# Patient Record
Sex: Female | Born: 1990
Health system: Southern US, Community
[De-identification: ages and names within clinical notes are randomized; demographics above are authoritative.]

## PROBLEM LIST (undated history)

## (undated) DIAGNOSIS — F32A Depression, unspecified: Secondary | ICD-10-CM

## (undated) DIAGNOSIS — E162 Hypoglycemia, unspecified: Secondary | ICD-10-CM

## (undated) DIAGNOSIS — D649 Anemia, unspecified: Secondary | ICD-10-CM

## (undated) DIAGNOSIS — F419 Anxiety disorder, unspecified: Secondary | ICD-10-CM

## (undated) DIAGNOSIS — K5792 Diverticulitis of intestine, part unspecified, without perforation or abscess without bleeding: Secondary | ICD-10-CM

## (undated) DIAGNOSIS — J45909 Unspecified asthma, uncomplicated: Secondary | ICD-10-CM

## (undated) HISTORY — DX: Hypoglycemia, unspecified: E16.2

## (undated) HISTORY — DX: Anemia, unspecified: D64.9

## (undated) HISTORY — DX: Diverticulitis of intestine, part unspecified, without perforation or abscess without bleeding: K57.92

## (undated) HISTORY — DX: Depression, unspecified: F32.A

## (undated) HISTORY — DX: Anxiety disorder, unspecified: F41.9

---
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
## 2011-07-01 ENCOUNTER — Emergency Department (HOSPITAL_COMMUNITY)
Admission: EM | Admit: 2011-07-01 | Discharge: 2011-07-01 | Disposition: A | Discharge status: Home / Self Care | Payer: 59 | Attending: Emergency Medicine | Admitting: Emergency Medicine

## 2011-07-01 DIAGNOSIS — R42 Dizziness and giddiness: Secondary | ICD-10-CM | POA: Insufficient documentation

## 2011-07-01 DIAGNOSIS — R112 Nausea with vomiting, unspecified: Secondary | ICD-10-CM | POA: Insufficient documentation

## 2011-07-01 LAB — POCT I-STAT, CHEM 8
BUN: 9 mg/dL (ref 6–23)
Calcium, Ion: 1.26 mmol/L (ref 1.12–1.32)
Chloride: 104 mEq/L (ref 96–112)
Creatinine, Ser: 0.6 mg/dL (ref 0.50–1.10)
Glucose, Bld: 112 mg/dL — ABNORMAL HIGH (ref 70–99)
HCT: 39 % (ref 36.0–46.0)
Hemoglobin: 13.3 g/dL (ref 12.0–15.0)
Potassium: 3.8 mEq/L (ref 3.5–5.1)
Sodium: 142 mEq/L (ref 135–145)
TCO2: 24 mmol/L (ref 0–100)

## 2011-07-01 LAB — GLUCOSE, CAPILLARY: Glucose-Capillary: 129 mg/dL — ABNORMAL HIGH (ref 70–99)

## 2011-08-05 LAB — BASIC METABOLIC PANEL
BUN: 7
CO2: 25
Calcium: 9
Chloride: 105
Creatinine, Ser: 0.53
Glucose, Bld: 82
Potassium: 4.5
Sodium: 139

## 2011-08-05 LAB — DIFFERENTIAL
Basophils Absolute: 0
Basophils Relative: 0
Eosinophils Absolute: 0.1
Eosinophils Relative: 1
Lymphocytes Relative: 20 — ABNORMAL LOW
Lymphs Abs: 1.8
Monocytes Absolute: 0.5
Monocytes Relative: 6
Neutro Abs: 6.4
Neutrophils Relative %: 72 — ABNORMAL HIGH

## 2011-08-05 LAB — URINALYSIS, ROUTINE W REFLEX MICROSCOPIC
Bilirubin Urine: NEGATIVE
Glucose, UA: NEGATIVE
Hgb urine dipstick: NEGATIVE
Ketones, ur: NEGATIVE
Nitrite: NEGATIVE
Protein, ur: NEGATIVE
Specific Gravity, Urine: 1.013
Urobilinogen, UA: 1
pH: 7

## 2011-08-05 LAB — CBC
HCT: 40.3
Hemoglobin: 13.7
MCHC: 34.1 — ABNORMAL HIGH
MCV: 86.4
Platelets: 284
RBC: 4.66
RDW: 12.7
WBC: 8.9

## 2011-08-05 LAB — POCT PREGNANCY, URINE
Operator id: 272551
Preg Test, Ur: NEGATIVE

## 2011-08-05 LAB — URINE CULTURE
Colony Count: NO GROWTH
Culture: NO GROWTH

## 2011-10-25 ENCOUNTER — Ambulatory Visit (INDEPENDENT_AMBULATORY_CARE_PROVIDER_SITE_OTHER): Payer: 59

## 2011-10-25 DIAGNOSIS — R05 Cough: Secondary | ICD-10-CM

## 2011-10-25 DIAGNOSIS — R059 Cough, unspecified: Secondary | ICD-10-CM

## 2011-10-25 DIAGNOSIS — J029 Acute pharyngitis, unspecified: Secondary | ICD-10-CM

## 2012-02-06 ENCOUNTER — Ambulatory Visit (INDEPENDENT_AMBULATORY_CARE_PROVIDER_SITE_OTHER): Payer: 59 | Admitting: Family

## 2012-02-06 ENCOUNTER — Encounter: Payer: Self-pay | Admitting: Family

## 2012-02-06 VITALS — BP 110/70 | Ht 69.0 in | Wt 144.0 lb

## 2012-02-06 DIAGNOSIS — T7840XA Allergy, unspecified, initial encounter: Secondary | ICD-10-CM

## 2012-02-06 DIAGNOSIS — L299 Pruritus, unspecified: Secondary | ICD-10-CM

## 2012-02-06 MED ORDER — PREDNISONE 20 MG PO TABS: 60.0000 mg | ORAL_TABLET | Freq: Every day | ORAL | Status: AC

## 2012-02-06 MED ORDER — HYDROXYZINE PAMOATE 50 MG PO CAPS: 50.0000 mg | ORAL_CAPSULE | Freq: Three times a day (TID) | ORAL | Status: AC | PRN

## 2012-02-06 NOTE — Progress Notes (Signed)
  Subjective:    Patient ID: Tara Jones, female    DOB: Jan 18, 1991, 21 y.o.   MRN: [id number]  HPI Comments: 21 yo white female presents with c/o "itching" all started one day ago. She noticed a hive on left arm two days before itching began  and states she has multiple allergies to medications and had hives in the past. Denies rash, hives, or other unusual areas on upper or lower extremities. Denies dry skin. Was seen in urgent care. Was given depo-medrol injection and benadryl prn. Stating both medications are ineffective and benadryl causing drowsiness. Denies fever, chills, shortness or breath, cp, or congestion.      Review of Systems  Constitutional: Negative.   HENT: Negative.   Respiratory: Negative.   Cardiovascular: Negative.   Musculoskeletal: Positive for arthralgias.  Skin: Negative for color change, pallor and rash.   Past Medical History  Diagnosis Date  . Diverticulitis   . Migraine   . Anemia   . Hypoglycemia     History   Social History  . Marital Status: Single    Spouse Name: N/A    Number of Children: N/A  . Years of Education: N/A   Occupational History  . Not on file.   Social History Main Topics  . Smoking status: Never Smoker   . Smokeless tobacco: Not on file  . Alcohol Use: No  . Drug Use: No  . Sexually Active: Not on file   Other Topics Concern  . Not on file   Social History Narrative  . No narrative on file    No past surgical history on file.  Family History  Problem Relation Age of Onset  . Hodgkin's lymphoma Mother   . Cancer Mother     breast, thyroid    Allergies  Allergen Reactions  . Amoxicillin   . Erythromycin   . Keflex   . Penicillins     No current outpatient prescriptions on file prior to visit.    BP 110/70  Ht 5\' 9"  (1.753 m)  Wt 144 lb (65.318 kg)  BMI 21.27 kg/m2  LMP 03/15/2013chart    Objective:   Physical Exam  Constitutional: She is oriented to person, place, and time. She appears  well-developed and well-nourished. No distress.  Cardiovascular: Normal rate, regular rhythm, normal heart sounds and intact distal pulses.  Exam reveals no gallop and no friction rub.   No murmur heard. Pulmonary/Chest: Effort normal and breath sounds normal. No respiratory distress. She has no wheezes. She has no rales. She exhibits no tenderness.  Neurological: She is alert and oriented to person, place, and time.  Skin: Skin is warm and dry. No rash noted. She is not diaphoretic. No erythema. No pallor.          Hive left arm red and itchy           Assessment & Plan:  Assessment: Allergic reaction, contact dermatitis Plan: Prednisone. Vistaril Teaching handouts provided on diagnosis and treatment. Opportunity for questions provided. Encouraged to RTC if s/s get worse.

## 2012-02-06 NOTE — Patient Instructions (Signed)
Contact Dermatitis  Contact dermatitis is a rash that happens when something touches the skin. You touched something that irritates your skin, or you have allergies to something you touched.  HOME CARE    Avoid the thing that caused your rash.   Keep your rash away from hot water, soap, sunlight, chemicals, and other things that might bother it.   Do not scratch your rash.   You can take cool baths to help stop itching.   Only take medicine as told by your doctor.   Keep all doctor visits as told.  GET HELP RIGHT AWAY IF:    Your rash is not better after 3 days.   Your rash gets worse.   Your rash is puffy (swollen), tender, red, sore, or warm.   You have problems with your medicine.  MAKE SURE YOU:    Understand these instructions.   Will watch your condition.   Will get help right away if you are not doing well or get worse.  Document Released: 08/10/2009 Document Revised: 10/02/2011 Document Reviewed: 03/18/2011  [employer] Patient Information 2012 [employer].

## 2012-04-27 ENCOUNTER — Ambulatory Visit (INDEPENDENT_AMBULATORY_CARE_PROVIDER_SITE_OTHER)
Admission: RE | Admit: 2012-04-27 | Discharge: 2012-04-27 | Disposition: A | Discharge status: Home / Self Care | Payer: 59 | Source: Ambulatory Visit | Attending: Family | Admitting: Family

## 2012-04-27 ENCOUNTER — Encounter: Payer: Self-pay | Admitting: Family

## 2012-04-27 ENCOUNTER — Ambulatory Visit (INDEPENDENT_AMBULATORY_CARE_PROVIDER_SITE_OTHER): Payer: 59 | Admitting: Family

## 2012-04-27 VITALS — BP 100/60 | Temp 98.3°F | Wt 143.0 lb

## 2012-04-27 DIAGNOSIS — M79609 Pain in unspecified limb: Secondary | ICD-10-CM

## 2012-04-27 DIAGNOSIS — S6990XA Unspecified injury of unspecified wrist, hand and finger(s), initial encounter: Secondary | ICD-10-CM

## 2012-04-27 DIAGNOSIS — M79641 Pain in right hand: Secondary | ICD-10-CM

## 2012-04-27 DIAGNOSIS — S6991XA Unspecified injury of right wrist, hand and finger(s), initial encounter: Secondary | ICD-10-CM

## 2012-04-27 NOTE — Patient Instructions (Signed)

## 2012-04-27 NOTE — Progress Notes (Signed)
  Subjective:    Patient ID: Tara Jones, female    DOB: 1991/01/20, 21 y.o.   MRN: [id number]  HPI 21 year old white female, nonsmoker and is in with complaints of right hand pain x5 days. Patient reports hitting her hand up against the wall at home. She didn't notice much discomfort at the time, but since moving, her pain is increased in intensity and swelling. She's been taken over-the-counter Aleve that helped the swelling but continues to have pain. 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 Recent pain a 6/10, worse with movement.  Review of Systems  Constitutional: Negative.   Respiratory: Negative.   Cardiovascular: Negative.   Musculoskeletal: Negative.        Right hand pain from injury.  Skin: Negative.   Neurological: Negative.    Past Medical History  Diagnosis Date  . Diverticulitis   . Migraine   . Anemia   . Hypoglycemia     History   Social History  . Marital Status: Single    Spouse Name: N/A    Number of Children: N/A  . Years of Education: N/A   Occupational History  . Not on file.   Social History Main Topics  . Smoking status: Never Smoker   . Smokeless tobacco: Not on file  . Alcohol Use: No  . Drug Use: No  . Sexually Active: Not on file   Other Topics Concern  . Not on file   Social History Narrative  . No narrative on file    No past surgical history on file.  Family History  Problem Relation Age of Onset  . Hodgkin's lymphoma Mother   . Cancer Mother     breast, thyroid    Allergies  Allergen Reactions  . Amoxicillin   . Cephalexin   . Erythromycin   . Penicillins     No current outpatient prescriptions on file prior to visit.    BP 100/60  Temp 98.3 F (36.8 C) (Oral)  Wt 143 lb (64.864 kg)  LMP 07/02/2013chart    Objective:   Physical Exam  Constitutional: She is oriented to person, place, and time. She appears well-developed and well-nourished.  Neck: Normal range of motion. Neck supple.  Cardiovascular: Normal rate,  regular rhythm and normal heart sounds.   Pulmonary/Chest: Effort normal and breath sounds normal.  Abdominal: Soft. Bowel sounds are normal.  Musculoskeletal: Normal range of motion.       Tenderness to palpation over the knuckle of the right hand, third digit. Mild swelling. No prednisone or signs of bruising. Full range of motion with mild to moderate pain. Pain elicited with flexion. Radial pulses 2/2  Neurological: She is alert and oriented to person, place, and time. She has normal reflexes.  Skin: Skin is warm and dry.  Psychiatric: She has a normal mood and affect.          Assessment & Plan:   Assessment: Right hand contusion, right hand injury  Plan: Over-the-counter Aleve twice daily. X-ray of the right hand when the patient in the results. Ace wrap to the hand when necessary to reduce swelling. Patient call the office if symptoms worsen or persist. Recheck a schedule, appearing.

## 2012-07-13 ENCOUNTER — Emergency Department (HOSPITAL_COMMUNITY)
Admission: EM | Admit: 2012-07-13 | Discharge: 2012-07-14 | Disposition: A | Discharge status: Home / Self Care | Payer: 59 | Attending: Emergency Medicine | Admitting: Emergency Medicine

## 2012-07-13 ENCOUNTER — Encounter (HOSPITAL_COMMUNITY): Payer: Self-pay | Admitting: *Deleted

## 2012-07-13 ENCOUNTER — Emergency Department (HOSPITAL_COMMUNITY): Payer: 59

## 2012-07-13 DIAGNOSIS — Z881 Allergy status to other antibiotic agents status: Secondary | ICD-10-CM | POA: Insufficient documentation

## 2012-07-13 DIAGNOSIS — Z880 Allergy status to penicillin: Secondary | ICD-10-CM | POA: Insufficient documentation

## 2012-07-13 DIAGNOSIS — Z808 Family history of malignant neoplasm of other organs or systems: Secondary | ICD-10-CM | POA: Insufficient documentation

## 2012-07-13 DIAGNOSIS — R0789 Other chest pain: Secondary | ICD-10-CM

## 2012-07-13 DIAGNOSIS — Z803 Family history of malignant neoplasm of breast: Secondary | ICD-10-CM | POA: Insufficient documentation

## 2012-07-13 DIAGNOSIS — R071 Chest pain on breathing: Secondary | ICD-10-CM | POA: Insufficient documentation

## 2012-07-13 DIAGNOSIS — Z807 Family history of other malignant neoplasms of lymphoid, hematopoietic and related tissues: Secondary | ICD-10-CM | POA: Insufficient documentation

## 2012-07-13 NOTE — ED Notes (Signed)
Pt c/o left sided chest pain on and off x 4 days; intermittent; increase with any movement; sharp; constant x 3 hours prior to arrival

## 2012-07-14 LAB — CBC WITH DIFFERENTIAL/PLATELET
Basophils Absolute: 0.1 10*3/uL (ref 0.0–0.1)
Basophils Relative: 1 % (ref 0–1)
Eosinophils Absolute: 0 10*3/uL (ref 0.0–0.7)
Eosinophils Relative: 1 % (ref 0–5)
HCT: 35.7 % — ABNORMAL LOW (ref 36.0–46.0)
Hemoglobin: 12.5 g/dL (ref 12.0–15.0)
Lymphocytes Relative: 28 % (ref 12–46)
Lymphs Abs: 1.7 10*3/uL (ref 0.7–4.0)
MCH: 29.3 pg (ref 26.0–34.0)
MCHC: 35 g/dL (ref 30.0–36.0)
MCV: 83.8 fL (ref 78.0–100.0)
Monocytes Absolute: 0.6 10*3/uL (ref 0.1–1.0)
Monocytes Relative: 9 % (ref 3–12)
Neutro Abs: 3.6 10*3/uL (ref 1.7–7.7)
Neutrophils Relative %: 61 % (ref 43–77)
Platelets: 245 10*3/uL (ref 150–400)
RBC: 4.26 MIL/uL (ref 3.87–5.11)
RDW: 12.2 % (ref 11.5–15.5)
WBC: 6 10*3/uL (ref 4.0–10.5)

## 2012-07-14 LAB — D-DIMER, QUANTITATIVE: D-Dimer, Quant: <0.27 ug/mL-FEU (ref 0.00–0.48)

## 2012-07-14 LAB — COMPREHENSIVE METABOLIC PANEL
ALT: 9 U/L (ref 0–35)
AST: 14 U/L (ref 0–37)
Albumin: 3.9 g/dL (ref 3.5–5.2)
Alkaline Phosphatase: 59 U/L (ref 39–117)
BUN: 11 mg/dL (ref 6–23)
CO2: 24 mEq/L (ref 19–32)
Calcium: 8.9 mg/dL (ref 8.4–10.5)
Chloride: 101 mEq/L (ref 96–112)
Creatinine, Ser: 0.63 mg/dL (ref 0.50–1.10)
GFR calc Af Amer: >90 mL/min (ref >90)
GFR calc non Af Amer: >90 mL/min (ref >90)
Glucose, Bld: 90 mg/dL (ref 70–99)
Potassium: 3.6 mEq/L (ref 3.5–5.1)
Sodium: 135 mEq/L (ref 135–145)
Total Bilirubin: 0.4 mg/dL (ref 0.3–1.2)
Total Protein: 6.4 g/dL (ref 6.0–8.3)

## 2012-07-14 LAB — TROPONIN I: Troponin I: <0.3 ng/mL (ref <0.30)

## 2012-07-14 MED ORDER — IBUPROFEN 800 MG PO TABS: 800.0000 mg | ORAL_TABLET | Freq: Three times a day (TID) | ORAL | Status: DC

## 2012-07-14 MED ORDER — KETOROLAC TROMETHAMINE 30 MG/ML IJ SOLN
30.0000 mg | Freq: Once | INTRAMUSCULAR | Status: AC
  Administered 2012-07-14: 30 mg via INTRAVENOUS
  Filled 2012-07-14: qty 1

## 2012-07-14 NOTE — ED Provider Notes (Signed)
History     CSN: [id number]  Arrival date & time 07/13/12  2133   First MD Initiated Contact with Patient 07/14/12 0154      Chief Complaint  Patient presents with  . Chest Pain    (Consider location/radiation/quality/duration/timing/severity/associated sxs/prior treatment) HPI Comments: Present with left-sided chest pain that she's had constantly for the past 6 hours. She has intermittently had pain for the past 3 days. It is worse with deep breathing and movement. There is no shortness of breath, cough or fever. She is pain with palpation of her chest wall moving her left arm. There is no abdominal pain, nausea or vomiting. She denies any leg pain or swelling. She is not on birth control. She took a recent car trip to the beach. She's not take anything for the pain.  The history is provided by the patient.    Past Medical History  Diagnosis Date  . Diverticulitis   . Migraine   . Anemia   . Hypoglycemia     History reviewed. No pertinent past surgical history.  Family History  Problem Relation Age of Onset  . Hodgkin's lymphoma Mother   . Cancer Mother     breast, thyroid    History  Substance Use Topics  . Smoking status: Never Smoker   . Smokeless tobacco: Not on file  . Alcohol Use: No    OB History    Grav Para Term Preterm Abortions TAB SAB Ect Mult Living                  Review of Systems  Constitutional: Negative for fever, activity change and appetite change.  HENT: Negative for congestion and rhinorrhea.   Respiratory: Positive for chest tightness. Negative for cough and shortness of breath.   Cardiovascular: Positive for chest pain.  Gastrointestinal: Negative for nausea, vomiting and abdominal pain.  Genitourinary: Negative for dysuria and hematuria.  Musculoskeletal: Negative for back pain.  Skin: Negative for rash.  Neurological: Negative for dizziness, weakness and headaches.    Allergies  Amoxicillin; Cephalexin; Erythromycin; and  Penicillins  Home Medications   Current Outpatient Rx  Name Route Sig Dispense Refill  . IBUPROFEN 200 MG PO TABS Oral Take 400 mg by mouth every 6 (six) hours as needed.    . IBUPROFEN 800 MG PO TABS Oral Take 1 tablet (800 mg total) by mouth 3 (three) times daily. 21 tablet 0    BP 103/55  Pulse 89  Temp 98.4 F (36.9 C)  Resp 17  SpO2 100%  LMP 06/22/2012  Physical Exam  Constitutional: She is oriented to person, place, and time. She appears well-developed and well-nourished. No distress.  HENT:  Head: Normocephalic and atraumatic.  Mouth/Throat: Oropharynx is clear and moist. No oropharyngeal exudate.  Eyes: Conjunctivae normal and EOM are normal. Pupils are equal, round, and reactive to light.  Neck: Normal range of motion. Neck supple.  Cardiovascular: Normal rate, regular rhythm and normal heart sounds.   No murmur heard. Pulmonary/Chest: Effort normal and breath sounds normal. No respiratory distress. She exhibits tenderness.       Reproducible left-sided chest wall tenderness  Abdominal: Soft. There is no tenderness. There is no rebound and no guarding.  Musculoskeletal: Normal range of motion. She exhibits no edema and no tenderness.       No calves Asymmetry, no palpable cords  Neurological: She is alert and oriented to person, place, and time. No cranial nerve deficit.  Skin: Skin is warm.  ED Course  Procedures (including critical care time)  Labs Reviewed  CBC WITH DIFFERENTIAL - Abnormal; Notable for the following:    HCT 35.7 (*)     All other components within normal limits  COMPREHENSIVE METABOLIC PANEL  D-DIMER, QUANTITATIVE  TROPONIN I   Dg Chest 2 View  07/13/2012  *RADIOLOGY REPORT*  Clinical Data: Chest pain.  CHEST - 2 VIEW  Comparison: September 20, 2010.  Findings: Cardiomediastinal silhouette appears normal.  No acute pulmonary disease is noted.  Bony thorax is intact.  IMPRESSION: No acute cardiopulmonary abnormality seen.   Original Report  Authenticated By: Venita Sheffield., M.D.      1. Chest wall pain       MDM  Intermittent left-sided chest pain, worse with palpation, worse with movement and deep breathing. Vital stable, no distress.  Chest x-ray negative. D-dimer negative. We'll treated symptomatically for chest wall pain.     Date: 07/14/2012  Rate: 88  Rhythm: normal sinus rhythm  QRS Axis: normal  Intervals: normal  ST/T Wave abnormalities: normal  Conduction Disutrbances:none  Narrative Interpretation:   Old EKG Reviewed: none available    Glynn Octave, MD 07/14/12 [phone]

## 2012-07-28 ENCOUNTER — Ambulatory Visit (INDEPENDENT_AMBULATORY_CARE_PROVIDER_SITE_OTHER): Payer: 59 | Admitting: Family

## 2012-07-28 ENCOUNTER — Encounter: Payer: Self-pay | Admitting: Family

## 2012-07-28 ENCOUNTER — Ambulatory Visit (INDEPENDENT_AMBULATORY_CARE_PROVIDER_SITE_OTHER)
Admission: RE | Admit: 2012-07-28 | Discharge: 2012-07-28 | Disposition: A | Discharge status: Home / Self Care | Payer: 59 | Source: Ambulatory Visit | Attending: Family | Admitting: Family

## 2012-07-28 VITALS — BP 108/70 | HR 88 | Wt 140.0 lb

## 2012-07-28 DIAGNOSIS — M25529 Pain in unspecified elbow: Secondary | ICD-10-CM

## 2012-07-28 DIAGNOSIS — M25539 Pain in unspecified wrist: Secondary | ICD-10-CM

## 2012-07-28 MED ORDER — TRAMADOL HCL 50 MG PO TABS: 50.0000 mg | ORAL_TABLET | Freq: Three times a day (TID) | ORAL | Status: DC | PRN

## 2012-07-28 NOTE — Progress Notes (Signed)
  Subjective:    Patient ID: Tara Jones, female    DOB: 03-May-1991, 21 y.o.   MRN: [id number]  HPI 21 year old female is in today after a fall at work yesterday. Patient reports living in a puddle on the floor at [facility]. She immediately noticed pain to her right wrist, bruising to her right upper arm and buttocks. She has been taking ibuprofen without much pain relief. No previous history of injury to the wrist. Rates the pain 9/10 that is worse with movement. Pain is worse today than yesterday. Difficulty with "squeezing" motion.   Review of Systems  Constitutional: Negative.   Respiratory: Negative.   Cardiovascular: Negative.   Musculoskeletal: Negative.        Right wrist pain and bruising to the right upper arm.  Skin: Negative.   Neurological: Negative.   Hematological: Negative.   Psychiatric/Behavioral: Negative.    Past Medical History  Diagnosis Date  . Diverticulitis   . Migraine   . Anemia   . Hypoglycemia     History   Social History  . Marital Status: Single    Spouse Name: N/A    Number of Children: N/A  . Years of Education: N/A   Occupational History  . Not on file.   Social History Main Topics  . Smoking status: Never Smoker   . Smokeless tobacco: Not on file  . Alcohol Use: No  . Drug Use: No  . Sexually Active: Not on file   Other Topics Concern  . Not on file   Social History Narrative  . No narrative on file    No past surgical history on file.  Family History  Problem Relation Age of Onset  . Hodgkin's lymphoma Mother   . Cancer Mother     breast, thyroid    Allergies  Allergen Reactions  . Amoxicillin Hives  . Cephalexin Hives  . Erythromycin Nausea And Vomiting  . Penicillins Hives    Current Outpatient Prescriptions on File Prior to Visit  Medication Sig Dispense Refill  . ibuprofen (ADVIL,MOTRIN) 200 MG tablet Take 400 mg by mouth every 6 (six) hours as needed.      Marland Kitchen ibuprofen (ADVIL,MOTRIN) 800 MG tablet  Take 1 tablet (800 mg total) by mouth 3 (three) times daily.  21 tablet  0    BP 108/70  Pulse 88  Wt 140 lb (63.504 kg)  LMP 08/27/2013chart    Objective:   Physical Exam  Constitutional: She is oriented to person, place, and time. She appears well-developed and well-nourished.  Cardiovascular: Normal rate, regular rhythm and normal heart sounds.   Pulmonary/Chest: Effort normal and breath sounds normal.  Musculoskeletal:       Right wrist pain with ROM. Minimal bruising. Tenderness to palpation of the lateral aspect of the right wrist. Minimal swelling.   Neurological: She is alert and oriented to person, place, and time.  Skin: Skin is warm and dry.  Psychiatric: She has a normal mood and affect.          Assessment & Plan:  Assessment: Wrist pain, likely right wrist sprain  Plan: Xray of the right wrist. Will notify patient pending results. Ibuprofen 800mg  tid. Tramadol for pain. Warned of drowsiness. Note for work given. Call if symptoms worsen or persist. Recheck as scheduled and sooner as needed.

## 2012-07-28 NOTE — Patient Instructions (Signed)
Wrist Sprain °with Rehab °A sprain is an injury in which a ligament that maintains the proper alignment of a joint is partially or completely torn. The ligaments of the wrist are susceptible to sprains. Sprains are classified into three categories. Grade 1 sprains cause pain, but the tendon is not lengthened. Grade 2 sprains include a lengthened ligament because the ligament is stretched or partially ruptured. With grade 2 sprains there is still function, although the function may be diminished. Grade 3 sprains are characterized by a complete tear of the tendon or muscle, and function is usually impaired. °SYMPTOMS  °· Pain tenderness, inflammation, and/or bruising (contusion) of the injury. °· A "pop" or tear felt and/or heard at the time of injury. °· Decreased wrist function. °CAUSES  °A wrist sprain occurs when a force is placed on one or more ligaments that is greater than it/they can withstand. Common mechanisms of injury include: °· Catching a ball with you hands. °· Repetitive and/ or strenuous extension or flexion of the wrist. °RISK INCREASES WITH: °· Previous wrist injury. °· Contact sports (boxing or wrestling). °· Activities in which falling is common. °· Poor strength and flexibility. °· Improperly fitted or padded protective equipment. °PREVENTION °· Warm up and stretch properly before activity. °· Allow for adequate recovery between workouts. °· Maintain physical fitness: °· Strength, flexibility, and endurance. °· Cardiovascular fitness. °· Protect the wrist joint by limiting its motion with the use of taping, braces, or splints. °· Protect the wrist after injury for 6 to 12 months. °PROGNOSIS  °The prognosis for wrist sprains depends on the degree of injury. Grade 1 sprains require 2 to 6 weeks of treatment. Grade 2 sprains require 6 to 8 weeks of treatment, and grade 3 sprains require up to 12 weeks.  °RELATED COMPLICATIONS  °· Prolonged healing time, if improperly treated or  re-injured. °· Recurrent symptoms that result in a chronic problem. °· Injury to nearby structures (bone, cartilage, nerves, or tendons). °· Arthritis of the wrist. °· Inability to compete in athletics at a high level. °· Wrist stiffness or weakness. °· Progression to a complete rupture of the ligament. °TREATMENT  °Treatment initially involves resting from any activities that aggravate the symptoms, and the use of ice and medications to help reduce pain and inflammation. Your caregiver may recommend immobilizing the wrist for a period of time in order to reduce stress on the ligament and allow for healing. After immobilization it is important to perform strengthening and stretching exercises to help regain strength and a full range of motion. These exercises may be completed at home or with a therapist. Surgery is not usually required for wrist sprains, unless the ligament has been ruptured (grade 3 sprain). °MEDICATION  °· If pain medication is necessary, then nonsteroidal anti-inflammatory medications, such as aspirin and ibuprofen, or other minor pain relievers, such as acetaminophen, are often recommended. °· Do not take pain medication for 7 days before surgery. °· Prescription pain relievers may be given if deemed necessary by your caregiver. Use only as directed and only as much as you need. °HEAT AND COLD °· Cold treatment (icing) relieves pain and reduces inflammation. Cold treatment should be applied for 10 to 15 minutes every 2 to 3 hours for inflammation and pain and immediately after any activity that aggravates your symptoms. Use ice packs or massage the area with a piece of ice (ice massage). °· Heat treatment may be used prior to performing the stretching and strengthening activities prescribed by your   caregiver, physical therapist, or athletic trainer. Use a heat pack or soak your injury in warm water. °SEEK MEDICAL CARE IF: °· Treatment seems to offer no benefit, or the condition worsens. °· Any  medications produce adverse side effects. °EXERCISES °RANGE OF MOTION (ROM) AND STRETCHING EXERCISES - Wrist Sprain  °These exercises may help you when beginning to rehabilitate your injury. Your symptoms may resolve with or without further involvement from your physician, physical therapist or athletic trainer. While completing these exercises, remember:  °· Restoring tissue flexibility helps normal motion to return to the joints. This allows healthier, less painful movement and activity. °· An effective stretch should be held for at least 30 seconds. °· A stretch should never be painful. You should only feel a gentle lengthening or release in the stretched tissue. °RANGE OF MOTION  Wrist Flexion, Active-Assisted °· Extend your right / left elbow with your fingers pointing down.* °· Gently pull the back of your hand towards you until you feel a gentle stretch on the top of your forearm. °· Hold this position for __________ seconds. °Repeat __________ times. Complete this exercise __________ times per day.  °*If directed by your physician, physical therapist or athletic trainer, complete this stretch with your elbow bent rather than extended. °RANGE OF MOTION  Wrist Extension, Active-Assisted °· Extend your right / left elbow and turn your palm upwards.* °· Gently pull your palm/fingertips back so your wrist extends and your fingers point more toward the ground. °· You should feel a gentle stretch on the inside of your forearm. °· Hold this position for __________ seconds. °Repeat __________ times. Complete this exercise __________ times per day. °*If directed by your physician, physical therapist or athletic trainer, complete this stretch with your elbow bent, rather than extended. °RANGE OF MOTION  Supination, Active °· Stand or sit with your elbows at your side. Bend your right / left elbow to 90 degrees. °· Turn your palm upward until you feel a gentle stretch on the inside of your forearm. °· Hold this position  for __________ seconds. Slowly release and return to the starting position. °Repeat __________ times. Complete this stretch __________ times per day.  °RANGE OF MOTION  Pronation, Active °· Stand or sit with your elbows at your side. Bend your right / left elbow to 90 degrees. °· Turn your palm downward until you feel a gentle stretch on the top of your forearm. °· Hold this position for __________ seconds. Slowly release and return to the starting position. °Repeat __________ times. Complete this stretch __________ times per day.  °STRETCH - Wrist Flexion °· Place the back of your right / left hand on a tabletop leaving your elbow slightly bent. Your fingers should point away from your body. °· Gently press the back of your hand down onto the table by straightening your elbow. You should feel a stretch on the top of your forearm. °· Hold this position for __________ seconds. °Repeat __________ times. Complete this stretch __________ times per day.  °STRETCH  Wrist Extension °· Place your right / left fingertips on a tabletop leaving your elbow slightly bent. Your fingers should point backwards. °· Gently press your fingers and palm down onto the table by straightening your elbow. You should feel a stretch on the inside of your forearm. °· Hold this position for __________ seconds. °Repeat __________ times. Complete this stretch __________ times per day.  °STRENGTHENING EXERCISES - Wrist Sprain °These exercises may help you when beginning to rehabilitate your injury.   They may resolve your symptoms with or without further involvement from your physician, physical therapist or athletic trainer. While completing these exercises, remember:  °· Muscles can gain both the endurance and the strength needed for everyday activities through controlled exercises. °· Complete these exercises as instructed by your physician, physical therapist or athletic trainer. Progress with the resistance and repetition exercises only as your  caregiver advises. °STRENGTH Wrist Flexors °· Sit with your right / left forearm palm-up and fully supported. Your elbow should be resting below the height of your shoulder. Allow your wrist to extend over the edge of the surface. °· Loosely holding a __________ weight or a piece of rubber exercise band/tubing, slowly curl your hand up toward your forearm. °· Hold this position for __________ seconds. Slowly lower the wrist back to the starting position in a controlled manner. °Repeat __________ times. Complete this exercise __________ times per day.  °STRENGTH  Wrist Extensors °· Sit with your right / left forearm palm-down and fully supported. Your elbow should be resting below the height of your shoulder. Allow your wrist to extend over the edge of the surface. °· Loosely holding a __________ weight or a piece of rubber exercise band/tubing, slowly curl your hand up toward your forearm. °· Hold this position for __________ seconds. Slowly lower the wrist back to the starting position in a controlled manner. °Repeat __________ times. Complete this exercise __________ times per day.  °STRENGTH - Ulnar Deviators °· Stand with a ____________________ weight in your right / left hand, or sit holding on to the rubber exercise band/tubing with your opposite arm supported. °· Move your wrist so that your pinkie travels toward your forearm and your thumb moves away from your forearm. °· Hold this position for __________ seconds and then slowly lower the wrist back to the starting position. °Repeat __________ times. Complete this exercise __________ times per day °STRENGTH - Radial Deviators °· Stand with a ____________________ weight in your °· right / left hand, or sit holding on to the rubber exercise band/tubing with your arm supported. °· Raise your hand upward in front of you or pull up on the rubber tubing. °· Hold this position for __________ seconds and then slowly lower the wrist back to the starting  position. °Repeat __________ times. Complete this exercise __________ times per day. °STRENGTH  Forearm Supinators °· Sit with your right / left forearm supported on a table, keeping your elbow below shoulder height. Rest your hand over the edge, palm down. °· Gently grip a hammer or a soup ladle. °· Without moving your elbow, slowly turn your palm and hand upward to a "thumbs-up" position. °· Hold this position for __________ seconds. Slowly return to the starting position. °Repeat __________ times. Complete this exercise __________ times per day.  °STRENGTH  Forearm Pronators °· Sit with your right / left forearm supported on a table, keeping your elbow below shoulder height. Rest your hand over the edge, palm up. °· Gently grip a hammer or a soup ladle. °· Without moving your elbow, slowly turn your palm and hand upward to a "thumbs-up" position. °· Hold this position for __________ seconds. Slowly return to the starting position. °Repeat __________ times. Complete this exercise __________ times per day.  °STRENGTH - Grip °· Grasp a tennis ball, a dense sponge, or a large, rolled sock in your hand. °· Squeeze as hard as you can without increasing any pain. °· Hold this position for __________ seconds. Release your grip slowly. °Repeat   __________ times. Complete this exercise __________ times per day.  °Document Released: 10/13/2005 Document Revised: 01/05/2012 Document Reviewed: 01/25/2009 °[employer]® Patient Information ©2013 [employer]. ° °

## 2012-07-29 ENCOUNTER — Telehealth: Payer: Self-pay | Admitting: Family

## 2012-07-29 NOTE — Telephone Encounter (Signed)
See xray results. 

## 2012-07-29 NOTE — Telephone Encounter (Signed)
Pt called req to get xray result. Pls call before 11am today. Lv detailed vm if pt not avail.

## 2012-11-19 ENCOUNTER — Ambulatory Visit (INDEPENDENT_AMBULATORY_CARE_PROVIDER_SITE_OTHER): Payer: 59 | Admitting: Family

## 2012-11-19 ENCOUNTER — Encounter: Payer: Self-pay | Admitting: Family

## 2012-11-19 VITALS — BP 100/60 | HR 71 | Temp 98.1°F | Wt 149.0 lb

## 2012-11-19 DIAGNOSIS — J309 Allergic rhinitis, unspecified: Secondary | ICD-10-CM

## 2012-11-19 DIAGNOSIS — J029 Acute pharyngitis, unspecified: Secondary | ICD-10-CM

## 2012-11-19 MED ORDER — FLUTICASONE PROPIONATE 50 MCG/ACT NA SUSP: 2.0000 | Freq: Every day | NASAL | Status: AC

## 2012-11-19 NOTE — Progress Notes (Signed)
  Subjective:    Patient ID: Tara Jones, female    DOB: [birth year], 22 y.o.   MRN: [id number]  HPI 22 year old WF, nonsmoker, is in today with c/o sore throat, congestion, x 1 day. Denies fever. No OTC meds.    Review of Systems  Constitutional: Negative.   HENT: Positive for congestion, sore throat and rhinorrhea.   Respiratory: Positive for cough.   Cardiovascular: Negative.   Musculoskeletal: Negative.   Skin: Negative.   Neurological: Negative.   Hematological: Negative.   Psychiatric/Behavioral: Negative.    Past Medical History  Diagnosis Date  . Diverticulitis   . Migraine   . Anemia   . Hypoglycemia     History   Social History  . Marital Status: Single    Spouse Name: N/A    Number of Children: N/A  . Years of Education: N/A   Occupational History  . Not on file.   Social History Main Topics  . Smoking status: Never Smoker   . Smokeless tobacco: Not on file  . Alcohol Use: No  . Drug Use: No  . Sexually Active: Not on file   Other Topics Concern  . Not on file   Social History Narrative  . No narrative on file    No past surgical history on file.  Family History  Problem Relation Age of Onset  . Hodgkin's lymphoma Mother   . Cancer Mother     breast, thyroid    Allergies  Allergen Reactions  . Amoxicillin Hives  . Cephalexin Hives  . Erythromycin Nausea And Vomiting  . Penicillins Hives    Current Outpatient Prescriptions on File Prior to Visit  Medication Sig Dispense Refill  . fluticasone (FLONASE) 50 MCG/ACT nasal spray Place 2 sprays into the nose daily.  16 g  6  . ibuprofen (ADVIL,MOTRIN) 200 MG tablet Take 400 mg by mouth every 6 (six) hours as needed.      Marland Kitchen ibuprofen (ADVIL,MOTRIN) 800 MG tablet Take 1 tablet (800 mg total) by mouth 3 (three) times daily.  21 tablet  0  . traMADol (ULTRAM) 50 MG tablet Take 1 tablet (50 mg total) by mouth every 8 (eight) hours as needed for pain.  30 tablet  0    BP 100/60  Pulse 71   Temp 98.1 F (36.7 C) (Oral)  Wt 149 lb (67.586 kg)  SpO2 98%chart    Objective:   Physical Exam  Constitutional: She is oriented to person, place, and time. She appears well-developed and well-nourished.  HENT:  Right Ear: External ear normal.  Left Ear: External ear normal.  Nose: Nose normal.  Mouth/Throat: Oropharynx is clear and moist.  Neck: Neck supple.  Pulmonary/Chest: Effort normal and breath sounds normal.  Neurological: She is oriented to person, place, and time.  Skin: Skin is warm and dry.  Psychiatric: She has a normal mood and affect.          Assessment & Plan:  Assessment:  1. Pharyngitis, Viral 2. Allergic Rhinitis  Plan: Claritin or Zyrtec once a day. Flonase 2 sprays in each nostril once a day. Call the office if symptoms worsen or persist. Recheck as scheduled and as needed sooner.

## 2012-11-19 NOTE — Patient Instructions (Addendum)
1. Claritin or Zyrtec once a day.  Viral Pharyngitis Viral pharyngitis is a viral infection that produces redness, pain, and swelling (inflammation) of the throat. It can spread from person to person (contagious). CAUSES Viral pharyngitis is caused by inhaling a large amount of certain germs called viruses. Many different viruses cause viral pharyngitis. SYMPTOMS Symptoms of viral pharyngitis include:  Sore throat.  Tiredness.  Stuffy nose.  Low-grade fever.  Congestion.  Cough. TREATMENT Treatment includes rest, drinking plenty of fluids, and the use of over-the-counter medication (approved by your caregiver). HOME CARE INSTRUCTIONS   Drink enough fluids to keep your urine clear or pale yellow.  Eat soft, cold foods such as ice cream, frozen ice pops, or gelatin dessert.  Gargle with warm salt water (1 tsp salt per 1 qt of water).  If over age 80, throat lozenges may be used safely.  Only take over-the-counter or prescription medicines for pain, discomfort, or fever as directed by your caregiver. Do not take aspirin. To help prevent spreading viral pharyngitis to others, avoid:  Mouth-to-mouth contact with others.  Sharing utensils for eating and drinking.  Coughing around others. SEEK MEDICAL CARE IF:   You are better in a few days, then become worse.  You have a fever or pain not helped by pain medicines.  There are any other changes that concern you. Document Released: 07/23/2005 Document Revised: 01/05/2012 Document Reviewed: 12/19/2010 [facility] Patient Information 2013 [place], [place].

## 2014-01-04 ENCOUNTER — Encounter: Payer: Self-pay | Admitting: Family

## 2014-01-04 ENCOUNTER — Ambulatory Visit (INDEPENDENT_AMBULATORY_CARE_PROVIDER_SITE_OTHER): Payer: 59 | Admitting: Family

## 2014-01-04 VITALS — BP 108/70 | HR 81 | Wt 169.0 lb

## 2014-01-04 DIAGNOSIS — S61209A Unspecified open wound of unspecified finger without damage to nail, initial encounter: Secondary | ICD-10-CM

## 2014-01-04 DIAGNOSIS — S61019A Laceration without foreign body of unspecified thumb without damage to nail, initial encounter: Secondary | ICD-10-CM

## 2014-01-04 DIAGNOSIS — Z23 Encounter for immunization: Secondary | ICD-10-CM

## 2014-01-04 DIAGNOSIS — B079 Viral wart, unspecified: Secondary | ICD-10-CM

## 2014-01-04 NOTE — Patient Instructions (Signed)
Warts Warts are a common viral infection. They are most commonly caused by the human papillomavirus (HPV). Warts can occur at all ages. However, they occur most frequently in older children and infrequently in the elderly. Warts may be single or multiple. Location and size varies. Warts can be spread by scratching the wart and then scratching normal skin. The life cycle of warts varies. However, most will disappear over many months to a couple years. Warts commonly do not cause problems (asymptomatic) unless they are over an area of pressure, such as the bottom of the foot. If they are large enough, they may cause pain with walking. DIAGNOSIS  Warts are most commonly diagnosed by their appearance. Tissue samples (biopsies) are not required unless the wart looks abnormal. Most warts have a rough surface, are round, oval, or irregular, and are skin-colored to light yellow, brown, or gray. They are generally less than  inch (1.3 cm), but they can be any size. TREATMENT   Observation or no treatment.  Freezing with liquid nitrogen.  High heat (cautery).  Boosting the body's immunity to fight off the wart (immunotherapy using Candida antigen).  Laser surgery.  Application of various irritants and solutions. HOME CARE INSTRUCTIONS  Follow your caregiver's instructions. No special precautions are necessary. Often, treatment may be followed by a return (recurrence) of warts. Warts are generally difficult to treat and get rid of. If treatment is done in a clinic setting, usually more than 1 treatment is required. This is usually done on only a monthly basis until the wart is completely gone. SEEK IMMEDIATE MEDICAL CARE IF: The treated skin becomes red, puffy (swollen), or painful. Document Released: 07/23/2005 Document Revised: 02/07/2013 Document Reviewed: 01/18/2010 [employer] Patient Information 2014 [employer].  

## 2014-01-04 NOTE — Progress Notes (Signed)
Pre visit review using our clinic review tool, if applicable. No additional management support is needed unless otherwise documented below in the visit note. 

## 2014-01-04 NOTE — Progress Notes (Signed)
Subjective:    Patient ID: Tara CoddingKathleen C Osborn, female    DOB: April 22, 1991, 23 y.o.   MRN: [id number]  HPI 23 year old white female, nonsmoker is in today with complaints of a laceration to her right thumb x12 hours after cutting her hand on a bottle opener. The bleeding has subsided. Last tetanus unknown. Is concerned because she has underlying wart at the site of the laceration. Reports tenderness to touch.   Review of Systems  Constitutional: Negative.   Respiratory: Negative.   Cardiovascular: Negative.   Musculoskeletal: Negative.   Skin:       Laceration right thumb  Psychiatric/Behavioral: Negative.    Past Medical History  Diagnosis Date  . Diverticulitis   . Migraine   . Anemia   . Hypoglycemia     History   Social History  . Marital Status: Single    Spouse Name: N/A    Number of Children: N/A  . Years of Education: N/A   Occupational History  . Not on file.   Social History Main Topics  . Smoking status: Never Smoker   . Smokeless tobacco: Not on file  . Alcohol Use: No  . Drug Use: No  . Sexual Activity: Not on file   Other Topics Concern  . Not on file   Social History Narrative  . No narrative on file    No past surgical history on file.  Family History  Problem Relation Age of Onset  . Hodgkin's lymphoma Mother   . Cancer Mother     breast, thyroid    Allergies  Allergen Reactions  . Amoxicillin Hives  . Cephalexin Hives  . Erythromycin Nausea And Vomiting  . Penicillins Hives    Current Outpatient Prescriptions on File Prior to Visit  Medication Sig Dispense Refill  . fluticasone (FLONASE) 50 MCG/ACT nasal spray Place 2 sprays into the nose daily.  16 g  6  . ibuprofen (ADVIL,MOTRIN) 200 MG tablet Take 400 mg by mouth every 6 (six) hours as needed.      Marland Kitchen. ibuprofen (ADVIL,MOTRIN) 800 MG tablet Take 1 tablet (800 mg total) by mouth 3 (three) times daily.  21 tablet  0  . traMADol (ULTRAM) 50 MG tablet Take 1 tablet (50 mg total) by  mouth every 8 (eight) hours as needed for pain.  30 tablet  0   No current facility-administered medications on file prior to visit.    BP 108/70  Pulse 81  Wt 169 lb (76.658 kg)chart    Objective:   Physical Exam  Constitutional: She is oriented to person, place, and time. She appears well-developed and well-nourished.  Neck: Normal range of motion. Neck supple.  Cardiovascular: Normal rate, regular rhythm and normal heart sounds.   Pulmonary/Chest: Effort normal and breath sounds normal.  Neurological: She is alert and oriented to person, place, and time.  Skin: Skin is warm and dry.  1 cm laceration noted to the distal aspect of the right thumb, superficial, small viral wart also noted at the site. Dermabond applied.  Psychiatric: She has a normal mood and affect.          Assessment & Plan:  Nicholos JohnsKathleen was seen today for no specified reason.  Diagnoses and associated orders for this visit:  Viral wart  Need for prophylactic vaccination with combined diphtheria-tetanus-pertussis (DTP) vaccine - Tdap vaccine greater than or equal to 7yo IM  Laceration of thumb     Dermabond was applied to the site today. Consider cryotherapy of  the ward after one week. Recheck

## 2014-08-18 DIAGNOSIS — Z7951 Long term (current) use of inhaled steroids: Secondary | ICD-10-CM | POA: Diagnosis not present

## 2014-08-18 DIAGNOSIS — S60021A Contusion of right index finger without damage to nail, initial encounter: Secondary | ICD-10-CM | POA: Insufficient documentation

## 2014-08-18 DIAGNOSIS — W230XXA Caught, crushed, jammed, or pinched between moving objects, initial encounter: Secondary | ICD-10-CM | POA: Insufficient documentation

## 2014-08-18 DIAGNOSIS — Z8639 Personal history of other endocrine, nutritional and metabolic disease: Secondary | ICD-10-CM | POA: Diagnosis not present

## 2014-08-18 DIAGNOSIS — S67190A Crushing injury of right index finger, initial encounter: Secondary | ICD-10-CM | POA: Diagnosis present

## 2014-08-18 DIAGNOSIS — Y9289 Other specified places as the place of occurrence of the external cause: Secondary | ICD-10-CM | POA: Insufficient documentation

## 2014-08-18 DIAGNOSIS — S60031A Contusion of right middle finger without damage to nail, initial encounter: Secondary | ICD-10-CM | POA: Insufficient documentation

## 2014-08-18 DIAGNOSIS — Z862 Personal history of diseases of the blood and blood-forming organs and certain disorders involving the immune mechanism: Secondary | ICD-10-CM | POA: Diagnosis not present

## 2014-08-18 DIAGNOSIS — Z8679 Personal history of other diseases of the circulatory system: Secondary | ICD-10-CM | POA: Diagnosis not present

## 2014-08-18 DIAGNOSIS — Y9389 Activity, other specified: Secondary | ICD-10-CM | POA: Diagnosis not present

## 2014-08-18 DIAGNOSIS — Z880 Allergy status to penicillin: Secondary | ICD-10-CM | POA: Insufficient documentation

## 2014-08-18 DIAGNOSIS — Z8719 Personal history of other diseases of the digestive system: Secondary | ICD-10-CM | POA: Diagnosis not present

## 2014-08-19 ENCOUNTER — Emergency Department (HOSPITAL_COMMUNITY)
Admission: EM | Admit: 2014-08-19 | Discharge: 2014-08-19 | Disposition: A | Discharge status: Home / Self Care | Payer: 59 | Attending: Emergency Medicine | Admitting: Emergency Medicine

## 2014-08-19 ENCOUNTER — Encounter (HOSPITAL_COMMUNITY): Payer: Self-pay | Admitting: Emergency Medicine

## 2014-08-19 ENCOUNTER — Emergency Department (HOSPITAL_COMMUNITY): Payer: 59

## 2014-08-19 DIAGNOSIS — M79643 Pain in unspecified hand: Secondary | ICD-10-CM

## 2014-08-19 DIAGNOSIS — S6000XA Contusion of unspecified finger without damage to nail, initial encounter: Secondary | ICD-10-CM

## 2014-08-19 MED ORDER — TRAMADOL HCL 50 MG PO TABS: 50.0000 mg | ORAL_TABLET | Freq: Three times a day (TID) | ORAL | Status: DC | PRN

## 2014-08-19 MED ORDER — HYDROCODONE-ACETAMINOPHEN 5-325 MG PO TABS
2.0000 | ORAL_TABLET | Freq: Once | ORAL | Status: AC
  Administered 2014-08-19: 2 via ORAL
  Filled 2014-08-19: qty 2

## 2014-08-19 MED ORDER — IBUPROFEN 800 MG PO TABS: 800.0000 mg | ORAL_TABLET | Freq: Three times a day (TID) | ORAL | Status: AC

## 2014-08-19 NOTE — ED Notes (Signed)
Bed: WTR8 Expected date:  Expected time:  Means of arrival:  Comments: 

## 2014-08-19 NOTE — ED Notes (Addendum)
Pt arrived to the ED with a complaint of right finger pain. Pt slammed fingers in a door around 0930 yesterday am.  Pt now has pain in right index and right middle finger.

## 2014-08-19 NOTE — Discharge Instructions (Signed)
Crush Injury, Fingers or Toes A crush injury to the fingers or toes means the tissues have been damaged by being squeezed (compressed). There will be bleeding into the tissues and swelling. Often, blood will collect under the skin. When this happens, the skin on the finger often dies and may slough off (shed) 1 week to 10 days later. Usually, new skin is growing underneath. If the injury has been too severe and the tissue does not survive, the damaged tissue may begin to turn black over several days.  Wounds which occur because of the crushing may be stitched (sutured) shut. However, crush injuries are more likely to become infected than other injuries.These wounds may not be closed as tightly as other types of cuts to prevent infection. Nails involved are often lost. These usually grow back over several weeks.  DIAGNOSIS X-rays may be taken to see if there is any injury to the bones. TREATMENT Broken bones (fractures) may be treated with splinting, depending on the fracture. Often, no treatment is required for fractures of the last bone in the fingers or toes. HOME CARE INSTRUCTIONS   The crushed part should be raised (elevated) above the heart or center of the chest as much as possible for the first several days or as directed. This helps with pain and lessens swelling. Less swelling increases the chances that the crushed part will survive.  Put ice on the injured area.  Put ice in a plastic bag.  Place a towel between your skin and the bag.  Leave the ice on for 15-20 minutes, 03-04 times a day for the first 2 days.  Only take over-the-counter or prescription medicines for pain, discomfort, or fever as directed by your caregiver.  Use your injured part only as directed.  Change your bandages (dressings) as directed.  Keep all follow-up appointments as directed by your caregiver. Not keeping your appointment could result in a chronic or permanent injury, pain, and disability. If there is  any problem keeping the appointment, you must call to reschedule. SEEK IMMEDIATE MEDICAL CARE IF:   There is redness, swelling, or increasing pain in the wound area.  Pus is coming from the wound.  You have a fever.  You notice a bad smell coming from the wound or dressing.  The edges of the wound do not stay together after the sutures have been removed.  You are unable to move the injured finger or toe. MAKE SURE YOU:   Understand these instructions.  Will watch your condition.  Will get help right away if you are not doing well or get worse. Document Released: 10/13/2005 Document Revised: 01/05/2012 Document Reviewed: 02/28/2011 [facility] Patient Information 2015 [place], [place]. This information is not intended to replace advice given to you by your health care provider. Make sure you discuss any questions you have with your health care provider. RICE: Routine Care for Injuries The routine care of many injuries includes Rest, Ice, Compression, and Elevation (RICE). HOME CARE INSTRUCTIONS  Rest is needed to allow your body to heal. Routine activities can usually be resumed when comfortable. Injured tendons and bones can take up to 6 weeks to heal. Tendons are the cord-like structures that attach muscle to bone.  Ice following an injury helps keep the swelling down and reduces pain.  Put ice in a plastic bag.  Place a towel between your skin and the bag.  Leave the ice on for 15-20 minutes, 3-4 times a day, or as directed by your health care  provider. Do this while awake, for the first 24 to 48 hours. After that, continue as directed by your caregiver.  Compression helps keep swelling down. It also gives support and helps with discomfort. If an elastic bandage has been applied, it should be removed and reapplied every 3 to 4 hours. It should not be applied tightly, but firmly enough to keep swelling down. Watch fingers or toes for swelling, bluish discoloration, coldness,  numbness, or excessive pain. If any of these problems occur, remove the bandage and reapply loosely. Contact your caregiver if these problems continue.  Elevation helps reduce swelling and decreases pain. With extremities, such as the arms, hands, legs, and feet, the injured area should be placed near or above the level of the heart, if possible. SEEK IMMEDIATE MEDICAL CARE IF:  You have persistent pain and swelling.  You develop redness, numbness, or unexpected weakness.  Your symptoms are getting worse rather than improving after several days. These symptoms may indicate that further evaluation or further X-rays are needed. Sometimes, X-rays may not show a small broken bone (fracture) until 1 week or 10 days later. Make a follow-up appointment with your caregiver. Ask when your X-ray results will be ready. Make sure you get your X-ray results. Document Released: 01/25/2001 Document Revised: 10/18/2013 Document Reviewed: 03/14/2011 [facility] Patient Information 2015 [place], [place]. This information is not intended to replace advice given to you by your health care provider. Make sure you discuss any questions you have with your health care provider. Buddy Taping You have a minor finger or toe injury. It can be managed by buddy taping. Buddy taping means the injured finger or toe is taped to a healthy uninjured adjacent finger or toe. Most minor fractures and dislocations of the smaller fingers and toes will heal in 3 to 4 weeks. Buddy taping immobilizes and protects the area of injury. Buddy taping is not recommended for initial treatment of fractures of the thumb, longer fingers, or the great toe. Buddy taping should not be used for unstable or deformed fractures, but as fracture healing progresses it may be used for protection during rehabilitation. Fractured fingers and toes should be protected by buddy taping as long as the injury is still painful or swollen.  When an injury is buddy taped, place  a small piece of gauze or cotton between the digits that are taped. This helps prevent the skin from breaking down from increased moisture. Buddy taping allows you to get your injury wet when you bathe. Change the gauze and tape more often if it gets wet, and dry the space between the finger or toes. Use a sturdy, hard-soled shoe for better support if you have a fractured toe. In 2 to 3 weeks you can start motion exercises. This will keep the fingers or toes from becoming stiff.  SEEK IMMEDIATE MEDICAL CARE IF:   The injured area becomes cold, numb, or pale.  You have pain not controlled with medications.  You notice increasing deformity of the toe or finger. Document Released: 11/20/2004 Document Revised: 01/05/2012 Document Reviewed: 03/21/2009 [facility]Exit[facility] Patient Information 2015 [place], [place]. This information is not intended to replace advice given to you by your health care provider. Make sure you discuss any questions you have with your health care provider.

## 2014-08-28 NOTE — ED Provider Notes (Signed)
CSN: [id number]     Arrival date & time 08/18/14  2359 History   First MD Initiated Contact with Patient 08/19/14 0235     Chief Complaint  Patient presents with  . Hand Pain    (Consider location/radiation/quality/duration/timing/severity/associated sxs/prior Treatment) HPI Comments: 23 y/o female presenting to the ED for constant, throbbing, gradually worsening pain to her R index and middle fingers after slamming them in a car door yesterday at 0930. Pain is worse with movement and palpation. Patient has tried icing with no significant improvement. No loss of sensation or associated fever.  Patient is a 23 y.o. female presenting with hand pain. The history is provided by the patient. No language interpreter was used.  Hand Pain This is a new problem. The current episode started yesterday. The problem occurs constantly. The problem has been gradually worsening. Associated symptoms include arthralgias, joint swelling and myalgias. Pertinent negatives include no numbness or weakness. Exacerbated by: palpating and movement.    Past Medical History  Diagnosis Date  . Diverticulitis   . Migraine   . Anemia   . Hypoglycemia    History reviewed. No pertinent past surgical history. Family History  Problem Relation Age of Onset  . Hodgkin's lymphoma Mother   . Cancer Mother     breast, thyroid   History  Substance Use Topics  . Smoking status: Never Smoker   . Smokeless tobacco: Not on file  . Alcohol Use: No   OB History    No data available      Review of Systems  Musculoskeletal: Positive for myalgias, joint swelling and arthralgias.  Neurological: Negative for weakness and numbness.  All other systems reviewed and are negative.   Allergies  Amoxicillin; Cephalexin; Erythromycin; and Penicillins  Home Medications   Prior to Admission medications   Medication Sig Start Date End Date Taking? Authorizing Provider  fluticasone (FLONASE) 50 MCG/ACT nasal spray Place 2  sprays into the nose daily. 11/19/12   Baker PieriniPadonda B Campbell, FNP  ibuprofen (ADVIL,MOTRIN) 200 MG tablet Take 400 mg by mouth every 6 (six) hours as needed.    Historical Provider, MD  ibuprofen (ADVIL,MOTRIN) 800 MG tablet Take 1 tablet (800 mg total) by mouth 3 (three) times daily. 08/19/14   Antony MaduraKelly Keniel Ralston, PA-C  traMADol (ULTRAM) 50 MG tablet Take 1 tablet (50 mg total) by mouth every 8 (eight) hours as needed. 08/19/14   Antony MaduraKelly Contessa Preuss, PA-C   BP 122/79 mmHg  Pulse 76  Temp(Src) 97.8 F (36.6 C) (Oral)  Resp 16  SpO2 100%  LMP 08/19/2014   Physical Exam  Constitutional: She is oriented to person, place, and time. She appears well-developed and well-nourished. No distress.  Nontoxic/nonseptic appearing  HENT:  Head: Normocephalic and atraumatic.  Eyes: Conjunctivae and EOM are normal. No scleral icterus.  Neck: Normal range of motion.  Cardiovascular: Normal rate, regular rhythm and intact distal pulses.   Distal radial pulse 2+ in RUE  Pulmonary/Chest: Effort normal. No respiratory distress.  Chest expansion symmetric  Musculoskeletal: Normal range of motion.       Right hand: She exhibits tenderness and swelling (mild to distal R 2nd and 3rd digits). She exhibits no bony tenderness, normal capillary refill and no deformity. Normal sensation noted.       Hands: Neurological: She is alert and oriented to person, place, and time. She exhibits normal muscle tone. Coordination normal.  Sensation to light touch intact. Patient able to wiggle all fingers.  Skin: Skin is warm and dry.  No rash noted. She is not diaphoretic. No erythema. No pallor.  Psychiatric: She has a normal mood and affect. Her behavior is normal.  Nursing note and vitals reviewed.   ED Course  Procedures (including critical care time) Labs Review Labs Reviewed - No data to display  Imaging Review Dg Hand Complete Right  08/19/2014   CLINICAL DATA:  Right finger pain. Slammed fingers under lower around 930 yesterday  a.m. Patient now has pain in the right index and right middle finger.  EXAM: RIGHT HAND - COMPLETE 3+ VIEW  COMPARISON:  04/27/2012  FINDINGS: There is no evidence of fracture or dislocation. There is no evidence of arthropathy or other focal bone abnormality. Soft tissues are unremarkable.  IMPRESSION: Negative.   Electronically Signed   By: Burman NievesWilliam  Stevens M.D.   On: 08/19/2014 01:50     EKG Interpretation None      MDM   Final diagnoses:  Finger contusion, initial encounter    23 year old female presents to the emergency department for further evaluation of pain to her distal right second and third digits. Patient states that she got her fingers caught in a car door yesterday. Patient is neurovascularly intact on physical exam. Imaging negative for fracture or dislocation. No bony deformities appreciated. Buddy taping completed in ED and patient given Norco for pain control. Will refer to hand specialist should patient require follow-up. Outpatient RICE protocol and pain control with tramadol advised. Return precautions provided and patient agreeable to plan with no unaddressed concerns.   Filed Vitals:   08/19/14 0111  BP: 122/79  Pulse: 76  Temp: 97.8 F (36.6 C)  TempSrc: Oral  Resp: 16  SpO2: 100%     Antony MaduraKelly Skyanne Welle, PA-C 08/28/14 [phone]

## 2015-06-09 ENCOUNTER — Encounter (HOSPITAL_BASED_OUTPATIENT_CLINIC_OR_DEPARTMENT_OTHER): Payer: Self-pay | Admitting: *Deleted

## 2015-06-09 ENCOUNTER — Emergency Department (HOSPITAL_BASED_OUTPATIENT_CLINIC_OR_DEPARTMENT_OTHER): Payer: 59

## 2015-06-09 ENCOUNTER — Emergency Department (HOSPITAL_BASED_OUTPATIENT_CLINIC_OR_DEPARTMENT_OTHER)
Admission: EM | Admit: 2015-06-09 | Discharge: 2015-06-09 | Disposition: A | Discharge status: Home / Self Care | Payer: 59 | Attending: Emergency Medicine | Admitting: Emergency Medicine

## 2015-06-09 DIAGNOSIS — Z791 Long term (current) use of non-steroidal anti-inflammatories (NSAID): Secondary | ICD-10-CM | POA: Diagnosis not present

## 2015-06-09 DIAGNOSIS — Z8679 Personal history of other diseases of the circulatory system: Secondary | ICD-10-CM | POA: Diagnosis not present

## 2015-06-09 DIAGNOSIS — R1033 Periumbilical pain: Secondary | ICD-10-CM

## 2015-06-09 DIAGNOSIS — R1084 Generalized abdominal pain: Secondary | ICD-10-CM | POA: Insufficient documentation

## 2015-06-09 DIAGNOSIS — Z862 Personal history of diseases of the blood and blood-forming organs and certain disorders involving the immune mechanism: Secondary | ICD-10-CM | POA: Insufficient documentation

## 2015-06-09 DIAGNOSIS — R112 Nausea with vomiting, unspecified: Secondary | ICD-10-CM | POA: Diagnosis not present

## 2015-06-09 DIAGNOSIS — Z8719 Personal history of other diseases of the digestive system: Secondary | ICD-10-CM | POA: Diagnosis not present

## 2015-06-09 DIAGNOSIS — Z8639 Personal history of other endocrine, nutritional and metabolic disease: Secondary | ICD-10-CM | POA: Diagnosis not present

## 2015-06-09 DIAGNOSIS — Z3202 Encounter for pregnancy test, result negative: Secondary | ICD-10-CM | POA: Insufficient documentation

## 2015-06-09 DIAGNOSIS — Z720 Tobacco use: Secondary | ICD-10-CM | POA: Insufficient documentation

## 2015-06-09 DIAGNOSIS — Z7951 Long term (current) use of inhaled steroids: Secondary | ICD-10-CM | POA: Diagnosis not present

## 2015-06-09 DIAGNOSIS — R52 Pain, unspecified: Secondary | ICD-10-CM

## 2015-06-09 DIAGNOSIS — Z880 Allergy status to penicillin: Secondary | ICD-10-CM | POA: Diagnosis not present

## 2015-06-09 LAB — CBC
HCT: 40.3 % (ref 36.0–46.0)
Hemoglobin: 13.6 g/dL (ref 12.0–15.0)
MCH: 30 pg (ref 26.0–34.0)
MCHC: 33.7 g/dL (ref 30.0–36.0)
MCV: 89 fL (ref 78.0–100.0)
PLATELETS: 242 10*3/uL (ref 150–400)
RBC: 4.53 MIL/uL (ref 3.87–5.11)
RDW: 12.3 % (ref 11.5–15.5)
WBC: 6.7 10*3/uL (ref 4.0–10.5)

## 2015-06-09 LAB — COMPREHENSIVE METABOLIC PANEL
ALK PHOS: 78 U/L (ref 38–126)
ALT: 13 U/L — AB (ref 14–54)
ANION GAP: 8 (ref 5–15)
AST: 16 U/L (ref 15–41)
Albumin: 4.2 g/dL (ref 3.5–5.0)
BUN: 5 mg/dL — ABNORMAL LOW (ref 6–20)
CO2: 26 mmol/L (ref 22–32)
CREATININE: 0.53 mg/dL (ref 0.44–1.00)
Calcium: 9.1 mg/dL (ref 8.9–10.3)
Chloride: 106 mmol/L (ref 101–111)
GFR calc non Af Amer: >60 mL/min (ref >60)
Glucose, Bld: 95 mg/dL (ref 65–99)
POTASSIUM: 3.8 mmol/L (ref 3.5–5.1)
Sodium: 140 mmol/L (ref 135–145)
Total Bilirubin: 1.1 mg/dL (ref 0.3–1.2)
Total Protein: 6.9 g/dL (ref 6.5–8.1)

## 2015-06-09 LAB — URINALYSIS, ROUTINE W REFLEX MICROSCOPIC
Bilirubin Urine: NEGATIVE
GLUCOSE, UA: NEGATIVE mg/dL
Hgb urine dipstick: NEGATIVE
KETONES UR: NEGATIVE mg/dL
Leukocytes, UA: NEGATIVE
NITRITE: NEGATIVE
PROTEIN: NEGATIVE mg/dL
Specific Gravity, Urine: 1.005 (ref 1.005–1.030)
Urobilinogen, UA: 1 mg/dL (ref 0.0–1.0)
pH: 7.5 (ref 5.0–8.0)

## 2015-06-09 LAB — PREGNANCY, URINE: PREG TEST UR: NEGATIVE

## 2015-06-09 LAB — LIPASE, BLOOD: LIPASE: 23 U/L (ref 22–51)

## 2015-06-09 MED ORDER — IOHEXOL 300 MG/ML  SOLN
25.0000 mL | Freq: Once | INTRAMUSCULAR | Status: AC | PRN
  Administered 2015-06-09: 25 mL via ORAL

## 2015-06-09 MED ORDER — IOHEXOL 300 MG/ML  SOLN
100.0000 mL | Freq: Once | INTRAMUSCULAR | Status: AC | PRN
  Administered 2015-06-09: 100 mL via INTRAVENOUS

## 2015-06-09 MED ORDER — PANTOPRAZOLE SODIUM 40 MG PO TBEC
40.0000 mg | DELAYED_RELEASE_TABLET | Freq: Once | ORAL | Status: AC
  Administered 2015-06-09: 40 mg via ORAL
  Filled 2015-06-09: qty 1

## 2015-06-09 MED ORDER — MORPHINE SULFATE 4 MG/ML IJ SOLN
4.0000 mg | Freq: Once | INTRAMUSCULAR | Status: AC
  Administered 2015-06-09: 4 mg via INTRAVENOUS
  Filled 2015-06-09: qty 1

## 2015-06-09 MED ORDER — SODIUM CHLORIDE 0.9 % IV BOLUS (SEPSIS)
1000.0000 mL | Freq: Once | INTRAVENOUS | Status: AC
  Administered 2015-06-09: 1000 mL via INTRAVENOUS

## 2015-06-09 MED ORDER — HYDROCODONE-ACETAMINOPHEN 5-325 MG PO TABS
1.0000 | ORAL_TABLET | ORAL | Status: DC | PRN
Start: 2015-06-09 — End: 2017-03-06

## 2015-06-09 NOTE — ED Notes (Signed)
Patient transported to Ultrasound 

## 2015-06-09 NOTE — Discharge Instructions (Signed)
Take Vicodin for severe pain only. No driving or operating heavy machinery while taking vicodin. This medication may cause drowsiness.  Abdominal Pain, Women Abdominal (stomach, pelvic, or belly) pain can be caused by many things. It is important to tell your doctor:  The location of the pain.  Does it come and go or is it present all the time?  Are there things that start the pain (eating certain foods, exercise)?  Are there other symptoms associated with the pain (fever, nausea, vomiting, diarrhea)? All of this is helpful to know when trying to find the cause of the pain. CAUSES   Stomach: virus or bacteria infection, or ulcer.  Intestine: appendicitis (inflamed appendix), regional ileitis (Crohn's disease), ulcerative colitis (inflamed colon), irritable bowel syndrome, diverticulitis (inflamed diverticulum of the colon), or cancer of the stomach or intestine.  Gallbladder disease or stones in the gallbladder.  Kidney disease, kidney stones, or infection.  Pancreas infection or cancer.  Fibromyalgia (pain disorder).  Diseases of the female organs:  Uterus: fibroid (non-cancerous) tumors or infection.  Fallopian tubes: infection or tubal pregnancy.  Ovary: cysts or tumors.  Pelvic adhesions (scar tissue).  Endometriosis (uterus lining tissue growing in the pelvis and on the pelvic organs).  Pelvic congestion syndrome (female organs filling up with blood just before the menstrual period).  Pain with the menstrual period.  Pain with ovulation (producing an egg).  Pain with an IUD (intrauterine device, birth control) in the uterus.  Cancer of the female organs.  Functional pain (pain not caused by a disease, may improve without treatment).  Psychological pain.  Depression. DIAGNOSIS  Your doctor will decide the seriousness of your pain by doing an examination.  Blood tests.  X-rays.  Ultrasound.  CT scan (computed tomography, special type of X-ray).  MRI  (magnetic resonance imaging).  Cultures, for infection.  Barium enema (dye inserted in the large intestine, to better view it with X-rays).  Colonoscopy (looking in intestine with a lighted tube).  Laparoscopy (minor surgery, looking in abdomen with a lighted tube).  Major abdominal exploratory surgery (looking in abdomen with a large incision). TREATMENT  The treatment will depend on the cause of the pain.   Many cases can be observed and treated at home.  Over-the-counter medicines recommended by your caregiver.  Prescription medicine.  Antibiotics, for infection.  Birth control pills, for painful periods or for ovulation pain.  Hormone treatment, for endometriosis.  Nerve blocking injections.  Physical therapy.  Antidepressants.  Counseling with a psychologist or psychiatrist.  Minor or major surgery. HOME CARE INSTRUCTIONS   Do not take laxatives, unless directed by your caregiver.  Take over-the-counter pain medicine only if ordered by your caregiver. Do not take aspirin because it can cause an upset stomach or bleeding.  Try a clear liquid diet (broth or water) as ordered by your caregiver. Slowly move to a bland diet, as tolerated, if the pain is related to the stomach or intestine.  Have a thermometer and take your temperature several times a day, and record it.  Bed rest and sleep, if it helps the pain.  Avoid sexual intercourse, if it causes pain.  Avoid stressful situations.  Keep your follow-up appointments and tests, as your caregiver orders.  If the pain does not go away with medicine or surgery, you may try:  Acupuncture.  Relaxation exercises (yoga, meditation).  Group therapy.  Counseling. SEEK MEDICAL CARE IF:   You notice certain foods cause stomach pain.  Your home care treatment is  not helping your pain.  You need stronger pain medicine.  You want your IUD removed.  You feel faint or lightheaded.  You develop nausea and  vomiting.  You develop a rash.  You are having side effects or an allergy to your medicine. SEEK IMMEDIATE MEDICAL CARE IF:   Your pain does not go away or gets worse.  You have a fever.  Your pain is felt only in portions of the abdomen. The right side could possibly be appendicitis. The left lower portion of the abdomen could be colitis or diverticulitis.  You are passing blood in your stools (bright red or black tarry stools, with or without vomiting).  You have blood in your urine.  You develop chills, with or without a fever.  You pass out. MAKE SURE YOU:   Understand these instructions.  Will watch your condition.  Will get help right away if you are not doing well or get worse. Document Released: 08/10/2007 Document Revised: 02/27/2014 Document Reviewed: 08/30/2009 [facility] Patient Information 2015 [place], [place]. This information is not intended to replace advice given to you by your health care provider. Make sure you discuss any questions you have with your health care provider.

## 2015-06-09 NOTE — ED Notes (Signed)
Periumbilical abd pain since 1530- vomited x 1 last night- normal BM this morning

## 2015-06-09 NOTE — ED Provider Notes (Signed)
CSN: [id number]     Arrival date & time 06/09/15  1726 History   First MD Initiated Contact with Patient 06/09/15 1744     Chief Complaint  Patient presents with  . Abdominal Pain     (Consider location/radiation/quality/duration/timing/severity/associated sxs/prior Treatment) HPI Comments: 24 year old female presenting with sudden onset periumbilical abdominal pain beginning at 3:30 PM today. Yesterday evening, she was in a car and started to feel nauseous and had one episode of emesis. After that episode of emesis, she was feeling fine, and was asymptomatic when she woke up this morning. Pain described as sharp and severe, radiating to her epigastric region, worse with movement and laying flat. Denies ever having pain like this in the past. Had a normal bowel movement this morning, no diarrhea. No fevers, vaginal bleeding, discharge, urinary symptoms, chest pain or shortness of breath. LMP 05/26/2015.  Patient is a 24 y.o. female presenting with abdominal pain. The history is provided by the patient.  Abdominal Pain Associated symptoms: nausea and vomiting     Past Medical History  Diagnosis Date  . Diverticulitis   . Migraine   . Anemia   . Hypoglycemia    History reviewed. No pertinent past surgical history. Family History  Problem Relation Age of Onset  . Hodgkin's lymphoma Mother   . Cancer Mother     breast, thyroid   Social History  Substance Use Topics  . Smoking status: Current Every Day Smoker    Types: Cigarettes  . Smokeless tobacco: None  . Alcohol Use: No     Comment: 3 drinks/week   OB History    No data available     Review of Systems  Gastrointestinal: Positive for nausea, vomiting and abdominal pain.  All other systems reviewed and are negative.     Allergies  Amoxicillin; Cephalexin; Erythromycin; and Penicillins  Home Medications   Prior to Admission medications   Medication Sig Start Date End Date Taking? Authorizing Provider  ibuprofen  (ADVIL,MOTRIN) 200 MG tablet Take 400 mg by mouth every 6 (six) hours as needed.   Yes Historical Provider, MD  fluticasone (FLONASE) 50 MCG/ACT nasal spray Place 2 sprays into the nose daily. 11/19/12   Eulis Foster, FNP  HYDROcodone-acetaminophen (NORCO/VICODIN) 5-325 MG per tablet Take 1-2 tablets by mouth every 4 (four) hours as needed. 06/09/15   Dejia Ebron M Patina Spanier, PA-C  ibuprofen (ADVIL,MOTRIN) 800 MG tablet Take 1 tablet (800 mg total) by mouth 3 (three) times daily. 08/19/14   Antony Madura, PA-C  traMADol (ULTRAM) 50 MG tablet Take 1 tablet (50 mg total) by mouth every 8 (eight) hours as needed. 08/19/14   Antony Madura, PA-C   BP 116/71 mmHg  Pulse 93  Temp(Src) 98 F (36.7 C) (Oral)  Resp 18  Ht 5\' 9"  (1.753 m)  Wt 180 lb (81.647 kg)  BMI 26.57 kg/m2  SpO2 99%  LMP 05/19/2015 Physical Exam  Constitutional: She is oriented to person, place, and time. She appears well-developed and well-nourished. No distress.  HENT:  Head: Normocephalic and atraumatic.  Mouth/Throat: Oropharynx is clear and moist.  Eyes: Conjunctivae and EOM are normal.  Neck: Normal range of motion. Neck supple.  Cardiovascular: Normal rate, regular rhythm and normal heart sounds.   Pulmonary/Chest: Effort normal and breath sounds normal. No respiratory distress.  Abdominal: Soft. Normal appearance and bowel sounds are normal. There is tenderness. There is guarding and tenderness at McBurney's point. There is no rigidity, no rebound and negative Murphy's sign.  Generalized tenderness,  more so epigastric, periumbilical and right lower quadrant with guarding. No peritoneal signs.  Musculoskeletal: Normal range of motion. She exhibits no edema.  Neurological: She is alert and oriented to person, place, and time. No sensory deficit.  Skin: Skin is warm and dry.  Psychiatric: She has a normal mood and affect. Her behavior is normal.  Nursing note and vitals reviewed.   ED Course  Procedures (including critical care  time) Labs Review Labs Reviewed  COMPREHENSIVE METABOLIC PANEL - Abnormal; Notable for the following:    BUN 5 (*)    ALT 13 (*)    All other components within normal limits  URINALYSIS, ROUTINE W REFLEX MICROSCOPIC (NOT AT [facility])  PREGNANCY, URINE  LIPASE, BLOOD  CBC    Imaging Review US Transvaginal Non-ob  06/09/2015   CLINICAL DATA:  Paraumbilical and pelvic pain for 1 day  EXAM: TRANSABDOMINAL AND TRANSVAGINAL ULTRASOUND OF PELVIS  DOPPLER ULTRASOUND OF OVARIES  TECHNIQUE: Both transabdominal and transvaginal ultrasound examinations of the pelvis were performed. Transabdominal technique was performed for global imaging of the pelvis including uterus, ovaries, adnexal regions, and pelvic cul-de-sac.  It was necessary to proceed with endovaginal exam following the transabdominal exam to visualize the endometrium. Color and duplex Doppler ultrasound was utilized to evaluate blood flow to the ovaries.  COMPARISON:  None.  FINDINGS: Uterus  Measurements: 9.3 x 3.8 x 4.7 cm. No fibroids or other mass visualized.  Endometrium  Thickness: 16 mm.  No focal abnormality visualized.  Right ovary  Measurements: 4.0 x 3.2 x 3.1 cm. Follicular changes are noted.  Left ovary  Measurements: 4.4 x 2.0 x 2.8 cm. Follicular changes are noted.  Pulsed Doppler evaluation of both ovaries demonstrates normal low-resistance arterial and venous waveforms.  Other findings  No free fluid.  IMPRESSION: Unremarkable ultrasound of the pelvis. No evidence of ovarian torsion is seen.   Electronically Signed   By: Alcide Clever M.D.   On: 06/09/2015 22:03   US Pelvis Complete  06/09/2015   CLINICAL DATA:  Paraumbilical and pelvic pain for 1 day  EXAM: TRANSABDOMINAL AND TRANSVAGINAL ULTRASOUND OF PELVIS  DOPPLER ULTRASOUND OF OVARIES  TECHNIQUE: Both transabdominal and transvaginal ultrasound examinations of the pelvis were performed. Transabdominal technique was performed for global imaging of the pelvis including uterus,  ovaries, adnexal regions, and pelvic cul-de-sac.  It was necessary to proceed with endovaginal exam following the transabdominal exam to visualize the endometrium. Color and duplex Doppler ultrasound was utilized to evaluate blood flow to the ovaries.  COMPARISON:  None.  FINDINGS: Uterus  Measurements: 9.3 x 3.8 x 4.7 cm. No fibroids or other mass visualized.  Endometrium  Thickness: 16 mm.  No focal abnormality visualized.  Right ovary  Measurements: 4.0 x 3.2 x 3.1 cm. Follicular changes are noted.  Left ovary  Measurements: 4.4 x 2.0 x 2.8 cm. Follicular changes are noted.  Pulsed Doppler evaluation of both ovaries demonstrates normal low-resistance arterial and venous waveforms.  Other findings  No free fluid.  IMPRESSION: Unremarkable ultrasound of the pelvis. No evidence of ovarian torsion is seen.   Electronically Signed   By: Alcide Clever M.D.   On: 06/09/2015 22:03   Ct Abdomen Pelvis W Contrast  06/09/2015   CLINICAL DATA:  24 year old female with periumbilical abdominal pain and vomiting. Initial encounter.  EXAM: CT ABDOMEN AND PELVIS WITH CONTRAST  TECHNIQUE: Multidetector CT imaging of the abdomen and pelvis was performed using the standard protocol following bolus administration of intravenous contrast.  CONTRAST:  25mL OMNIPAQUE IOHEXOL 300 MG/ML SOLN, OMNIPAQUE IOHEXOL 300 MG/ML SOLN  COMPARISON:  CT Abdomen and Pelvis 11/03/2006 and earlier.  FINDINGS: Negative lung bases.  No pericardial or pleural effusion.  No osseous abnormality identified.  No pelvic free fluid. Uterus is within normal limits. The adnexae appear within normal limits. Mild to moderate bladder distension.  Mildly redundant sigmoid colon. Mild gaseous distension of the rectum. Negative left colon, transverse colon, right colon and appendix. Negative terminal ileum. Oral contrast has not yet reached the distal small bowel. No dilated small bowel loops. Negative stomach and duodenum.  Liver, gallbladder (Phrygian cap),  spleen, pancreas and adrenal glands are within normal limits. Portal venous system and major arterial structures appear normal. No abdominal free fluid. No lymphadenopathy. Both kidneys and ureters appear normal. No ventral abdominal hernia.  IMPRESSION: Normal CT Abdomen and Pelvis aside from urinary bladder distension. Normal retrocecal appendix.   Electronically Signed   By: Odessa Fleming M.D.   On: 06/09/2015 20:42   [place] Art/ven Flow Abd Pelv Doppler  06/09/2015   CLINICAL DATA:  Paraumbilical and pelvic pain for 1 day  EXAM: TRANSABDOMINAL AND TRANSVAGINAL ULTRASOUND OF PELVIS  DOPPLER ULTRASOUND OF OVARIES  TECHNIQUE: Both transabdominal and transvaginal ultrasound examinations of the pelvis were performed. Transabdominal technique was performed for global imaging of the pelvis including uterus, ovaries, adnexal regions, and pelvic cul-de-sac.  It was necessary to proceed with endovaginal exam following the transabdominal exam to visualize the endometrium. Color and duplex Doppler ultrasound was utilized to evaluate blood flow to the ovaries.  COMPARISON:  None.  FINDINGS: Uterus  Measurements: 9.3 x 3.8 x 4.7 cm. No fibroids or other mass visualized.  Endometrium  Thickness: 16 mm.  No focal abnormality visualized.  Right ovary  Measurements: 4.0 x 3.2 x 3.1 cm. Follicular changes are noted.  Left ovary  Measurements: 4.4 x 2.0 x 2.8 cm. Follicular changes are noted.  Pulsed Doppler evaluation of both ovaries demonstrates normal low-resistance arterial and venous waveforms.  Other findings  No free fluid.  IMPRESSION: Unremarkable ultrasound of the pelvis. No evidence of ovarian torsion is seen.   Electronically Signed   By: Alcide Clever M.D.   On: 06/09/2015 22:03   I, Celene Skeen, personally reviewed and evaluated these images and lab results as part of my medical decision-making.   EKG Interpretation None      MDM   Final diagnoses:  Periumbilical abdominal pain   Nontoxic appearing, NAD. AF VSS.  Abdomen is soft with no peritoneal signs. Pain periumbilical and initially worse periumbilical and epigastric, however reports since arriving to the ED, the pain is migrating from periumbilical region down, more so on the right. Concern for appendicitis. Labs, CT pending.  Labs without acute finding. CT without acute finding. Patient still in a significant amount of pain, mostly around her umbilicus. Will obtain ultrasound to rule out ovarian torsion.  [place] negative for torsion and is without any acute finding. Resting comfortably and tolerating PO, pain when laying flat and on palpation, not as severe as on arrival. Advised BRAT diet for a few days and to return with worsening symptoms or no improvement. F/u with PCP. Stable for d/c. Return precautions given. Patient states understanding of treatment care plan and is agreeable.  Kathrynn Speed, PA-C 06/09/15 2230  Blake Divine, MD 06/10/15 [phone]

## 2017-02-09 ENCOUNTER — Other Ambulatory Visit (HOSPITAL_COMMUNITY)
Admission: RE | Admit: 2017-02-09 | Discharge: 2017-02-09 | Disposition: A | Discharge status: Home / Self Care | Payer: 59 | Source: Ambulatory Visit | Attending: Obstetrics & Gynecology | Admitting: Obstetrics & Gynecology

## 2017-02-09 ENCOUNTER — Other Ambulatory Visit: Payer: Self-pay | Admitting: Obstetrics & Gynecology

## 2017-02-09 DIAGNOSIS — Z113 Encounter for screening for infections with a predominantly sexual mode of transmission: Secondary | ICD-10-CM | POA: Insufficient documentation

## 2017-02-09 DIAGNOSIS — Z01419 Encounter for gynecological examination (general) (routine) without abnormal findings: Secondary | ICD-10-CM | POA: Diagnosis not present

## 2017-02-13 LAB — CYTOLOGY - PAP
CHLAMYDIA, DNA PROBE: NEGATIVE
DIAGNOSIS: NEGATIVE
Neisseria Gonorrhea: NEGATIVE

## 2017-03-06 ENCOUNTER — Ambulatory Visit (HOSPITAL_COMMUNITY)
Admission: EM | Admit: 2017-03-06 | Discharge: 2017-03-06 | Disposition: A | Discharge status: Home / Self Care | Payer: 59 | Attending: Internal Medicine | Admitting: Internal Medicine

## 2017-03-06 ENCOUNTER — Encounter (HOSPITAL_COMMUNITY): Payer: Self-pay | Admitting: *Deleted

## 2017-03-06 DIAGNOSIS — L500 Allergic urticaria: Secondary | ICD-10-CM | POA: Diagnosis not present

## 2017-03-06 DIAGNOSIS — T7840XA Allergy, unspecified, initial encounter: Secondary | ICD-10-CM | POA: Diagnosis not present

## 2017-03-06 DIAGNOSIS — L299 Pruritus, unspecified: Secondary | ICD-10-CM

## 2017-03-06 MED ORDER — METHYLPREDNISOLONE ACETATE 40 MG/ML IJ SUSP
40.0000 mg | Freq: Once | INTRAMUSCULAR | Status: AC
  Administered 2017-03-06: 40 mg via INTRAMUSCULAR

## 2017-03-06 MED ORDER — DEXAMETHASONE SODIUM PHOSPHATE 10 MG/ML IJ SOLN
INTRAMUSCULAR | Status: AC
Start: 2017-03-06 — End: 2017-03-06
  Filled 2017-03-06: qty 1

## 2017-03-06 MED ORDER — DIPHENHYDRAMINE HCL 50 MG/ML IJ SOLN
INTRAMUSCULAR | Status: AC
  Filled 2017-03-06: qty 1

## 2017-03-06 MED ORDER — DEXAMETHASONE SODIUM PHOSPHATE 10 MG/ML IJ SOLN
10.0000 mg | Freq: Once | INTRAMUSCULAR | Status: AC
  Administered 2017-03-06: 10 mg via INTRAMUSCULAR

## 2017-03-06 MED ORDER — DIPHENHYDRAMINE HCL 50 MG/ML IJ SOLN
50.0000 mg | Freq: Once | INTRAMUSCULAR | Status: AC
  Administered 2017-03-06: 50 mg via INTRAMUSCULAR

## 2017-03-06 MED ORDER — PREDNISONE 20 MG PO TABS: ORAL_TABLET | ORAL | 0 refills | Status: DC

## 2017-03-06 MED ORDER — METHYLPREDNISOLONE ACETATE 40 MG/ML IJ SUSP
INTRAMUSCULAR | Status: AC
  Filled 2017-03-06: qty 1

## 2017-03-06 NOTE — ED Triage Notes (Signed)
Pt  Reports    About   2  Hours   Ago   Developed   A  Rash    And       Itching   With   Some  Tightness   In  Chest    Took   Some  Benadryl      But  Remains     Itching  And  Tightness  In  Her  Chest      Pt  Has     A  History  Of  Allergic       Reactions     No   Known    specefic    Causative  Agent        Has a  History  Of  Hives

## 2017-03-06 NOTE — ED Provider Notes (Signed)
CSN: [id number]     Arrival date & time 03/06/17  1930 History   First MD Initiated Contact with Patient 03/06/17 2017     Chief Complaint  Patient presents with  . Allergic Reaction   (Consider location/radiation/quality/duration/timing/severity/associated sxs/prior Treatment) 26 year old female presents with what she terms as allergic reaction. She states that she has hypersensitivity's, allergies and anaphylaxis to several different types of medicines and some environmental allergies. Approximately 5:30 she developed itching and hives. Her chest felt tight. She used her albuterol inhaler and she is breathing well. She later took 50 mg of Benadryl which is about 3 hours ago. She continues to have generalized pruritus with faint light red cutaneous rash. No wheals are seen. Denies any known swelling.      Past Medical History:  Diagnosis Date  . Anemia   . Diverticulitis   . Hypoglycemia   . Migraine    History reviewed. No pertinent surgical history. Family History  Problem Relation Age of Onset  . Hodgkin's lymphoma Mother   . Cancer Mother        breast, thyroid   Social History  Substance Use Topics  . Smoking status: Current Every Day Smoker    Types: Cigarettes  . Smokeless tobacco: Not on file  . Alcohol use No     Comment: 3 drinks/week   OB History    No data available     Review of Systems  Constitutional: Negative for activity change, appetite change, fatigue and fever.  HENT: Negative.   Eyes: Negative.   Respiratory: Negative.   Gastrointestinal: Negative.   Skin: Positive for rash.  Neurological: Negative.   All other systems reviewed and are negative.   Allergies  Amoxicillin; Cephalexin; Erythromycin; and Penicillins  Home Medications   Prior to Admission medications   Medication Sig Start Date End Date Taking? Authorizing Provider  ALBUTEROL IN Inhale into the lungs.   Yes [provider]  Loteprednol-Tobramycin (ZYLET OP) Apply to  eye.   Yes [provider]  fluticasone (FLONASE) 50 MCG/ACT nasal spray Place 2 sprays into the nose daily. 11/19/12   Eulis Foster, FNP  ibuprofen (ADVIL,MOTRIN) 200 MG tablet Take 400 mg by mouth every 6 (six) hours as needed.    [provider]  ibuprofen (ADVIL,MOTRIN) 800 MG tablet Take 1 tablet (800 mg total) by mouth 3 (three) times daily. 08/19/14   Antony Madura, PA-C  predniSONE (DELTASONE) 20 MG tablet 2 tabs po once daily x 3 days 03/06/17   Hayden Rasmussen, NP   Meds Ordered and Administered this Visit   Medications  dexamethasone (DECADRON) injection 10 mg (10 mg Intramuscular Given 03/06/17 2033)  methylPREDNISolone acetate (DEPO-MEDROL) injection 40 mg (40 mg Intramuscular Given 03/06/17 2031)  diphenhydrAMINE (BENADRYL) injection 50 mg (50 mg Intramuscular Given 03/06/17 2106)    BP 138/84 (BP Location: Right Arm)   Pulse 98   Temp 98.6 F (37 C) (Oral)   Resp 20   LMP 02/04/2017   SpO2 97%  No data found.   Physical Exam  Constitutional: She is oriented to person, place, and time. She appears well-developed and well-nourished.  HENT:  Head: Normocephalic and atraumatic.  Mouth/Throat: Oropharynx is clear and moist.  No intraoral lesions or swelling. Airway widely patent.  Eyes: Conjunctivae and EOM are normal.  Neck: Normal range of motion. Neck supple.  Cardiovascular: Normal rate, regular rhythm, normal heart sounds and intact distal pulses.   Pulmonary/Chest: Effort normal and breath sounds normal. No respiratory  distress. She has no wheezes. She has no rales. She exhibits tenderness.  Musculoskeletal: She exhibits no edema.  Lymphadenopathy:    She has no cervical adenopathy.  Neurological: She is alert and oriented to person, place, and time.  Skin: Skin is warm and dry.   spotty cutaneous erythema, fading.  Psychiatric: She has a normal mood and affect.  Nursing note and vitals reviewed.   Urgent Care Course     Procedures (including  critical care time)  Labs Review Labs Reviewed - No data to display  Imaging Review No results found.   Visual Acuity Review  Right Eye Distance:   Left Eye Distance:   Bilateral Distance:    Right Eye Near:   Left Eye Near:    Bilateral Near:         MDM   1. Allergic reaction, initial encounter    Take the prednisone over the next 2 or 3 days as directed if symptoms persist. Take with food. If you develop additional symptoms or allergic reaction you may need to go to the emergency department. He may also take Benadryl 50 mg every 4 hours. You received your injection at approximately 10 minutes after 9 PM. Meds ordered this encounter  Medications  . Loteprednol-Tobramycin (ZYLET OP)    Sig: Apply to eye.  . ALBUTEROL IN    Sig: Inhale into the lungs.  Marland Kitchen. dexamethasone (DECADRON) injection 10 mg  . methylPREDNISolone acetate (DEPO-MEDROL) injection 40 mg  . diphenhydrAMINE (BENADRYL) injection 50 mg  . predniSONE (DELTASONE) 20 MG tablet    Sig: 2 tabs po once daily x 3 days    Dispense:  6 tablet    Refill:  0    Order Specific Question:   Supervising Provider    Answer:   Eustace MooreMURRAY, LAURA W [[id number]]       Hayden RasmussenMabe, Flint Hakeem, NP 03/06/17 2108

## 2017-03-06 NOTE — Discharge Instructions (Signed)
Take the prednisone over the next 2 or 3 days as directed if symptoms persist. Take with food. If you develop additional symptoms or allergic reaction you may need to go to the emergency department. He may also take Benadryl 50 mg every 4 hours. You received your injection at approximately 10 minutes after 9 PM.

## 2018-10-19 DIAGNOSIS — J029 Acute pharyngitis, unspecified: Secondary | ICD-10-CM | POA: Diagnosis not present

## 2019-01-19 ENCOUNTER — Other Ambulatory Visit: Payer: Self-pay

## 2019-01-19 ENCOUNTER — Emergency Department (HOSPITAL_BASED_OUTPATIENT_CLINIC_OR_DEPARTMENT_OTHER)
Admission: EM | Admit: 2019-01-19 | Discharge: 2019-01-19 | Disposition: A | Discharge status: Home / Self Care | Payer: BLUE CROSS/BLUE SHIELD | Attending: Emergency Medicine | Admitting: Emergency Medicine

## 2019-01-19 ENCOUNTER — Emergency Department (HOSPITAL_BASED_OUTPATIENT_CLINIC_OR_DEPARTMENT_OTHER): Payer: BLUE CROSS/BLUE SHIELD

## 2019-01-19 ENCOUNTER — Encounter (HOSPITAL_BASED_OUTPATIENT_CLINIC_OR_DEPARTMENT_OTHER): Payer: Self-pay | Admitting: *Deleted

## 2019-01-19 DIAGNOSIS — J45909 Unspecified asthma, uncomplicated: Secondary | ICD-10-CM | POA: Insufficient documentation

## 2019-01-19 DIAGNOSIS — F17210 Nicotine dependence, cigarettes, uncomplicated: Secondary | ICD-10-CM | POA: Diagnosis not present

## 2019-01-19 DIAGNOSIS — R1033 Periumbilical pain: Secondary | ICD-10-CM | POA: Diagnosis not present

## 2019-01-19 DIAGNOSIS — Z79899 Other long term (current) drug therapy: Secondary | ICD-10-CM | POA: Diagnosis not present

## 2019-01-19 DIAGNOSIS — R102 Pelvic and perineal pain: Secondary | ICD-10-CM | POA: Diagnosis not present

## 2019-01-19 DIAGNOSIS — R1084 Generalized abdominal pain: Secondary | ICD-10-CM | POA: Diagnosis not present

## 2019-01-19 DIAGNOSIS — K7689 Other specified diseases of liver: Secondary | ICD-10-CM | POA: Diagnosis not present

## 2019-01-19 HISTORY — DX: Unspecified asthma, uncomplicated: J45.909

## 2019-01-19 LAB — COMPREHENSIVE METABOLIC PANEL
ALT: 13 U/L (ref 0–44)
ANION GAP: 7 (ref 5–15)
AST: 14 U/L — ABNORMAL LOW (ref 15–41)
Albumin: 4.1 g/dL (ref 3.5–5.0)
Alkaline Phosphatase: 76 U/L (ref 38–126)
BILIRUBIN TOTAL: 0.2 mg/dL — AB (ref 0.3–1.2)
BUN: 15 mg/dL (ref 6–20)
CALCIUM: 8.9 mg/dL (ref 8.9–10.3)
CO2: 23 mmol/L (ref 22–32)
Chloride: 107 mmol/L (ref 98–111)
Creatinine, Ser: 0.57 mg/dL (ref 0.44–1.00)
GFR calc Af Amer: >60 mL/min (ref >60)
Glucose, Bld: 95 mg/dL (ref 70–99)
POTASSIUM: 3.6 mmol/L (ref 3.5–5.1)
Sodium: 137 mmol/L (ref 135–145)
Total Protein: 6.9 g/dL (ref 6.5–8.1)

## 2019-01-19 LAB — WET PREP, GENITAL
SPERM: NONE SEEN
TRICH WET PREP: NONE SEEN
Yeast Wet Prep HPF POC: NONE SEEN

## 2019-01-19 LAB — CBC WITH DIFFERENTIAL/PLATELET
Abs Immature Granulocytes: 0.01 10*3/uL (ref 0.00–0.07)
Basophils Absolute: 0.1 10*3/uL (ref 0.0–0.1)
Basophils Relative: 1 %
EOS ABS: 0.3 10*3/uL (ref 0.0–0.5)
EOS PCT: 4 %
HEMATOCRIT: 41.6 % (ref 36.0–46.0)
Hemoglobin: 13.6 g/dL (ref 12.0–15.0)
Immature Granulocytes: 0 %
LYMPHS ABS: 1.5 10*3/uL (ref 0.7–4.0)
Lymphocytes Relative: 19 %
MCH: 29.1 pg (ref 26.0–34.0)
MCHC: 32.7 g/dL (ref 30.0–36.0)
MCV: 89.1 fL (ref 80.0–100.0)
MONOS PCT: 8 %
Monocytes Absolute: 0.6 10*3/uL (ref 0.1–1.0)
Neutro Abs: 5.2 10*3/uL (ref 1.7–7.7)
Neutrophils Relative %: 68 %
Platelets: 241 10*3/uL (ref 150–400)
RBC: 4.67 MIL/uL (ref 3.87–5.11)
RDW: 12 % (ref 11.5–15.5)
WBC: 7.6 10*3/uL (ref 4.0–10.5)
nRBC: 0 % (ref 0.0–0.2)

## 2019-01-19 LAB — URINALYSIS, ROUTINE W REFLEX MICROSCOPIC
Bilirubin Urine: NEGATIVE
GLUCOSE, UA: NEGATIVE mg/dL
Hgb urine dipstick: NEGATIVE
KETONES UR: NEGATIVE mg/dL
Leukocytes,Ua: NEGATIVE
Nitrite: NEGATIVE
PROTEIN: NEGATIVE mg/dL
Specific Gravity, Urine: 1.025 (ref 1.005–1.030)
pH: 6 (ref 5.0–8.0)

## 2019-01-19 LAB — PREGNANCY, URINE: Preg Test, Ur: NEGATIVE

## 2019-01-19 LAB — LIPASE, BLOOD: LIPASE: 31 U/L (ref 11–51)

## 2019-01-19 MED ORDER — DICYCLOMINE HCL 10 MG PO CAPS
20.0000 mg | ORAL_CAPSULE | Freq: Once | ORAL | Status: AC
  Administered 2019-01-19: 20 mg via ORAL
  Filled 2019-01-19: qty 2

## 2019-01-19 MED ORDER — DICYCLOMINE HCL 20 MG PO TABS: 20.0000 mg | ORAL_TABLET | Freq: Two times a day (BID) | ORAL | 0 refills | Status: AC

## 2019-01-19 MED ORDER — SODIUM CHLORIDE 0.9 % IV BOLUS
1000.0000 mL | Freq: Once | INTRAVENOUS | Status: AC
  Administered 2019-01-19: 1000 mL via INTRAVENOUS

## 2019-01-19 MED ORDER — MORPHINE SULFATE (PF) 4 MG/ML IV SOLN
4.0000 mg | Freq: Once | INTRAVENOUS | Status: AC
  Administered 2019-01-19: 4 mg via INTRAVENOUS
  Filled 2019-01-19: qty 1

## 2019-01-19 MED ORDER — IOHEXOL 300 MG/ML  SOLN
100.0000 mL | Freq: Once | INTRAMUSCULAR | Status: AC | PRN
  Administered 2019-01-19: 100 mL via INTRAVENOUS

## 2019-01-19 MED ORDER — ONDANSETRON HCL 4 MG/2ML IJ SOLN
4.0000 mg | Freq: Once | INTRAMUSCULAR | Status: AC
  Administered 2019-01-19: 4 mg via INTRAVENOUS
  Filled 2019-01-19: qty 2

## 2019-01-19 MED ORDER — METRONIDAZOLE 500 MG PO TABS: 500.0000 mg | ORAL_TABLET | Freq: Two times a day (BID) | ORAL | 0 refills | Status: AC

## 2019-01-19 NOTE — ED Provider Notes (Signed)
Care transfered from PA Joy at shift change. See note for full HPI.  In summation, 28 year old female with hx diverticulitis, anemia and asthma who presented for evaluation of abdominal pain x 2 days. Located to periumbilical and radiated to RLQ. Associated subjective fever and anorexia. Similar pain in past secondary to ovarian cyst. Normal BM. Last PO intake at 1PM.  CT scan negative for abdominal pathology. No evidence of appendicitis.  Pelvic exam without CMT. Los suspicion for PID. Does have cervix friability on exam. Will refer to [facility] for re-evaluation.  Labs reassuring without leukocytosis, anemia, trait, renal or liver abnormality. Pregnancy negative. Urinalysis negative for infection.  [place] pending. If negative FU with ObGyn. Dc with Flagyl for BV and bentyl  Physical Exam  BP 124/78   Pulse 78   Temp 98 F (36.7 C) (Oral)   Resp 16   Ht 5\' 9"  (1.753 m)   Wt 79.8 kg   LMP 12/26/2018   SpO2 100%   BMI 25.99 kg/m   Physical Exam Vitals signs and nursing note reviewed.  Constitutional:      General: She is not in acute distress.    Appearance: She is well-developed. She is not ill-appearing, toxic-appearing or diaphoretic.     Comments: Texting on phone initial evaluation.  No acute distress noted.  HENT:     Head: Atraumatic.     Mouth/Throat:     Comments: Mucous membranes moist. Eyes:     Pupils: Pupils are equal, round, and reactive to light.  Neck:     Musculoskeletal: Normal range of motion.  Cardiovascular:     Rate and Rhythm: Normal rate.  Pulmonary:     Effort: No respiratory distress.  Abdominal:     General: There is no distension.     Comments: Soft without rebound or guarding. Mild generalized tenderness to palpation. No CVA tenderness.   Musculoskeletal: Normal range of motion.     Comments: Moves all 4 extremities without difficulty.  Skin:    General: Skin is warm and dry.     Comments: No rashes or lesions.  Neurological:     Mental Status:  She is alert.    ED Course/Procedures     Procedures US Transvaginal Non-ob  Result Date: 01/19/2019 CLINICAL DATA:  Pelvic pain x2 days. EXAM: TRANSABDOMINAL AND TRANSVAGINAL ULTRASOUND OF PELVIS DOPPLER ULTRASOUND OF OVARIES TECHNIQUE: Both transabdominal and transvaginal ultrasound examinations of the pelvis were performed. Transabdominal technique was performed for global imaging of the pelvis including uterus, ovaries, adnexal regions, and pelvic cul-de-sac. It was necessary to proceed with endovaginal exam following the transabdominal exam to visualize the right ovary. Color and duplex Doppler ultrasound was utilized to evaluate blood flow to the ovaries. COMPARISON:  None. FINDINGS: Uterus Measurements: 8.7 x 4.3 x 5.7 cm = volume: 110 mL. No fibroids or other mass visualized. Endometrium Thickness: 17. Slight separation of the endometrial cavity may represent presence of an arcuate uterus, less likely septate or subseptate configuration given lack of apparent septum. Right ovary Measurements: 4.1 x 2.3 x 3.2 cm = volume: 16 mL. Normal appearance/no adnexal mass. Left ovary Measurements: 3.9 x 2.4 x 3 cm = volume: 15 mL. Normal appearance/no adnexal mass. Pulsed Doppler evaluation of both ovaries demonstrates normal low-resistance arterial and venous waveforms. Other findings No abnormal free fluid. IMPRESSION: No adnexal mass or torsion. Possible mild arcuate configuration of the endometrium. Electronically Signed   By: Tollie Eth M.D.   On: 01/19/2019 20:26  US Pelvis Complete  Result Date: 01/19/2019 CLINICAL DATA:  Pelvic pain x2 days. EXAM: TRANSABDOMINAL AND TRANSVAGINAL ULTRASOUND OF PELVIS DOPPLER ULTRASOUND OF OVARIES TECHNIQUE: Both transabdominal and transvaginal ultrasound examinations of the pelvis were performed. Transabdominal technique was performed for global imaging of the pelvis including uterus, ovaries, adnexal regions, and pelvic cul-de-sac. It was necessary to proceed  with endovaginal exam following the transabdominal exam to visualize the right ovary. Color and duplex Doppler ultrasound was utilized to evaluate blood flow to the ovaries. COMPARISON:  None. FINDINGS: Uterus Measurements: 8.7 x 4.3 x 5.7 cm = volume: 110 mL. No fibroids or other mass visualized. Endometrium Thickness: 17. Slight separation of the endometrial cavity may represent presence of an arcuate uterus, less likely septate or subseptate configuration given lack of apparent septum. Right ovary Measurements: 4.1 x 2.3 x 3.2 cm = volume: 16 mL. Normal appearance/no adnexal mass. Left ovary Measurements: 3.9 x 2.4 x 3 cm = volume: 15 mL. Normal appearance/no adnexal mass. Pulsed Doppler evaluation of both ovaries demonstrates normal low-resistance arterial and venous waveforms. Other findings No abnormal free fluid. IMPRESSION: No adnexal mass or torsion. Possible mild arcuate configuration of the endometrium. Electronically Signed   By: Tollie Eth M.D.   On: 01/19/2019 20:26   Ct Abdomen Pelvis W Contrast  Result Date: 01/19/2019 CLINICAL DATA:  Diffuse abdominal pain 2 days EXAM: CT ABDOMEN AND PELVIS WITH CONTRAST TECHNIQUE: Multidetector CT imaging of the abdomen and pelvis was performed using the standard protocol following bolus administration of intravenous contrast. CONTRAST:  OMNIPAQUE IOHEXOL 300 MG/ML  SOLN COMPARISON:  06/09/2015 FINDINGS: Lower chest: No acute abnormality. Hepatobiliary: 3 x 2.2 cm right hepatic mass with peripheral enhancement and central low attenuation. No other focal hepatic mass. No gallstones, gallbladder wall thickening, or biliary dilatation. Pancreas: Unremarkable. No pancreatic ductal dilatation or surrounding inflammatory changes. Spleen: Normal in size without focal abnormality. Adrenals/Urinary Tract: Adrenal glands are unremarkable. Kidneys are normal, without renal calculi, focal lesion, or hydronephrosis. Bladder is unremarkable. Stomach/Bowel: Stomach is  within normal limits. Appendix appears normal. No evidence of bowel wall thickening, distention, or inflammatory changes. Vascular/Lymphatic: No significant vascular findings are present. No enlarged abdominal or pelvic lymph nodes. Reproductive: Uterus and bilateral adnexa are unremarkable. Other: No abdominal wall hernia or abnormality. No abdominopelvic ascites. Musculoskeletal: No acute or significant osseous findings. IMPRESSION: 1. No acute abdominal or pelvic pathology. 2. Indeterminate 3 x 2.2 cm right hepatic mass. This likely reflects focal nodular hyperplasia, but recommend further evaluation with a nonemergent liver protocol MRI of the abdomen without and with intravenous contrast for better characterization. Electronically Signed   By: Elige Ko   On: 01/19/2019 18:03   [place] Art/ven Flow Abd Pelv Doppler  Result Date: 01/19/2019 CLINICAL DATA:  Pelvic pain x2 days. EXAM: TRANSABDOMINAL AND TRANSVAGINAL ULTRASOUND OF PELVIS DOPPLER ULTRASOUND OF OVARIES TECHNIQUE: Both transabdominal and transvaginal ultrasound examinations of the pelvis were performed. Transabdominal technique was performed for global imaging of the pelvis including uterus, ovaries, adnexal regions, and pelvic cul-de-sac. It was necessary to proceed with endovaginal exam following the transabdominal exam to visualize the right ovary. Color and duplex Doppler ultrasound was utilized to evaluate blood flow to the ovaries. COMPARISON:  None. FINDINGS: Uterus Measurements: 8.7 x 4.3 x 5.7 cm = volume: 110 mL. No fibroids or other mass visualized. Endometrium Thickness: 17. Slight separation of the endometrial cavity may represent presence of an arcuate uterus, less likely septate or subseptate configuration given lack of apparent septum.  Right ovary Measurements: 4.1 x 2.3 x 3.2 cm = volume: 16 mL. Normal appearance/no adnexal mass. Left ovary Measurements: 3.9 x 2.4 x 3 cm = volume: 15 mL. Normal appearance/no adnexal mass. Pulsed  Doppler evaluation of both ovaries demonstrates normal low-resistance arterial and venous waveforms. Other findings No abnormal free fluid. IMPRESSION: No adnexal mass or torsion. Possible mild arcuate configuration of the endometrium. Electronically Signed   By: Tollie Eth M.D.   On: 01/19/2019 20:26   Labs Reviewed  WET PREP, GENITAL - Abnormal; Notable for the following components:      Result Value   Clue Cells Wet Prep HPF POC PRESENT (*)    WBC, Wet Prep HPF POC MANY (*)    All other components within normal limits  COMPREHENSIVE METABOLIC PANEL - Abnormal; Notable for the following components:   AST 14 (*)    Total Bilirubin 0.2 (*)    All other components within normal limits  URINALYSIS, ROUTINE W REFLEX MICROSCOPIC  PREGNANCY, URINE  CBC WITH DIFFERENTIAL/PLATELET  LIPASE, BLOOD  RPR  HIV ANTIBODY (ROUTINE TESTING W REFLEX)  GC/CHLAMYDIA PROBE AMP ([place]) NOT AT [facility]   MDM  Care transferred from PA Joy at shift change.  28 year old female who appears otherwise well presents for evaluation of abdominal pain.  Originally periumbilical and has migrated to the right lower quadrant over the last 2 days.  Associated subjective fever as well as anorexia.    CT scan personally reviewed--negative for abdominopelvic pathology, specifically no evidence of appendicitis.  Indeterminate 3 x 2.2 cm right hepatic mass. This likely reflects focal nodular hyperplasia, but recommend further evaluation with a nonemergent liver protocol MRI of the abdomen without and with intravenous contrast for better characterization.-- Patient to follow up with GI outpatient for this.  Labs-- CBC without leukocytosis.  Metabolic panel without electrolyte, renal or liver abnormality. Pregnancy negative. Urinalysis negative for infection.  Pelvic exam performed by previous provider.  No cervical motion tenderness or adnexal tenderness low suspicion for PID, cervicitis, TOA.  Ultrasound without evidence  of ovarian torsion, possible mild arcuate configuration of the endometrium.  Will dc home with Flagyl for BV.  Patient is nontoxic, nonseptic appearing, in no apparent distress.  Patient's pain and other symptoms adequately managed in emergency department.  Fluid bolus given.  Labs, imaging and vitals reviewed.  Patient does not meet the SIRS or Sepsis criteria.  On repeat exam patient does not have a surgical abdomin and there are no peritoneal signs.  No indication of appendicitis, bowel obstruction, bowel perforation, cholecystitis, diverticulitis, PID or ectopic pregnancy.  Patient discharged home with symptomatic treatment and given strict instructions for follow-up with their primary care physician.  I have also discussed reasons to return immediately to the ER.  Discussed with patient possible early appendicitis.  Patient to return to emergency department if symptoms worsen.  Able to tolerate p.o. intake in department without difficulty.  Patient expresses understanding and agrees with plan.  1. Abdominal pain 2. Bacterial vaginosis     Kourtney Montesinos A, PA-C 01/19/19 2048    Tegeler, Canary Brim, MD 01/19/19 2053

## 2019-01-19 NOTE — ED Triage Notes (Signed)
Pt c/o diffuse abd pain x 2 days PMD directed pt to ed for EVAL

## 2019-01-19 NOTE — ED Notes (Signed)
Pt reports slightly less uncomfortable.  Denies nausea.

## 2019-01-19 NOTE — ED Notes (Signed)
Patient transported to CT 

## 2019-01-19 NOTE — ED Provider Notes (Signed)
[facility] EMERGENCY DEPARTMENT Provider Note   CSN: [id number] Arrival date & time: 01/19/19  1610    History   Chief Complaint Chief Complaint  Patient presents with   Abdominal Pain    HPI Tara Jones is a 28 y.o. female.     HPI   Tara Jones is a 28 y.o. female, with a history of anemia, asthma, and diverticulitis, presenting to the ED with abdominal pain beginning 2 days ago.  Pain began periumbilical, stabbing, constant, 7/10 at rest, 10/10 with palpation or movement.  Yesterday, pain began to radiate toward the right side of the abdomen.  Accompanied by subjective fever, chills, and anorexia. She has had similar pain in the past due to ovarian cyst, but not in this location. Last bowel movement was earlier today and was normal.  Last food intake was around 1 PM today.  Denies urinary symptoms, back/flank pain, N/V/C/D, abnormal vaginal bleeding/discharge, or any other complaints.   Past Medical History:  Diagnosis Date   Anemia    Asthma    Diverticulitis    Hypoglycemia    Migraine     There are no active problems to display for this patient.   History reviewed. No pertinent surgical history.   OB History   No obstetric history on file.      Home Medications    Prior to Admission medications   Medication Sig Start Date End Date Taking? Authorizing Provider  ALBUTEROL IN Inhale into the lungs.    [provider]  fluticasone (FLONASE) 50 MCG/ACT nasal spray Place 2 sprays into the nose daily. 11/19/12   Eulis Foster, FNP  ibuprofen (ADVIL,MOTRIN) 200 MG tablet Take 400 mg by mouth every 6 (six) hours as needed.    [provider]  ibuprofen (ADVIL,MOTRIN) 800 MG tablet Take 1 tablet (800 mg total) by mouth 3 (three) times daily. 08/19/14   Antony Madura, PA-C  Loteprednol-Tobramycin (ZYLET OP) Apply to eye.    [provider]    Family History Family History  Problem Relation Age of Onset     Hodgkin's lymphoma Mother    Cancer Mother        breast, thyroid    Social History Social History   Tobacco Use   Smoking status: Current Every Day Smoker    Packs/day: 0.50    Types: Cigarettes   Smokeless tobacco: Never Used  Substance Use Topics   Alcohol use: No    Comment: 3 drinks/week   Drug use: No     Allergies   Doxycycline; Penicillins; Amoxicillin; Cephalexin; and Erythromycin   Review of Systems Review of Systems  Constitutional: Positive for fever.  Respiratory: Negative for shortness of breath.   Cardiovascular: Negative for chest pain.  Gastrointestinal: Positive for abdominal pain. Negative for blood in stool, constipation, diarrhea, nausea and vomiting.  Genitourinary: Negative for dysuria, flank pain, hematuria, vaginal bleeding and vaginal discharge.  Musculoskeletal: Negative for back pain.  Neurological: Negative for weakness and numbness.  All other systems reviewed and are negative.    Physical Exam Updated Vital Signs BP 128/76    Pulse 82    Temp 98.3 F (36.8 C)    Resp 18    Ht  (1.753 m)    Wt 79.8 kg    LMP 12/26/2018    SpO2 100%    BMI 25.99 kg/m   Physical Exam Vitals signs and nursing note reviewed.  Constitutional:      General:  She is not in acute distress.    Appearance: She is well-developed. She is not diaphoretic.  HENT:     Head: Normocephalic and atraumatic.     Mouth/Throat:     Mouth: Mucous membranes are moist.     Pharynx: Oropharynx is clear.  Eyes:     Conjunctiva/sclera: Conjunctivae normal.  Neck:     Musculoskeletal: Neck supple.  Cardiovascular:     Rate and Rhythm: Normal rate and regular rhythm.     Pulses: Normal pulses.     Comments: Tactile temperature in the extremities appropriate and equal bilaterally. Pulmonary:     Effort: Pulmonary effort is normal. No respiratory distress.  Abdominal:     General: Bowel sounds are normal.     Palpations: Abdomen is soft.     Tenderness:  There is abdominal tenderness. There is no guarding.    Genitourinary:    Cervix: Friability present.     Comments: External genitalia normal Vagina with discharge - small amount of yellow tinged discharge Cervix  abnormal - erosions around the cervical os, mildly friable negative for cervical motion tenderness Adnexa palpated, no masses, negative for tenderness noted Bladder palpated negative for tenderness Uterus palpated no masses, negative for tenderness  No inguinal lymphadenopathy. Otherwise normal female genitalia. [place], [place], served as Biomedical engineer during exam. Musculoskeletal:     Right lower leg: No edema.     Left lower leg: No edema.  Lymphadenopathy:     Cervical: No cervical adenopathy.  Skin:    General: Skin is warm and dry.  Neurological:     Mental Status: She is alert.  Psychiatric:        Mood and Affect: Mood and affect normal.        Speech: Speech normal.        Behavior: Behavior normal.      ED Treatments / Results  Labs (all labs ordered are listed, but only abnormal results are displayed) Labs Reviewed  WET PREP, GENITAL - Abnormal; Notable for the following components:      Result Value   Clue Cells Wet Prep HPF POC PRESENT (*)    WBC, Wet Prep HPF POC MANY (*)    All other components within normal limits  COMPREHENSIVE METABOLIC PANEL - Abnormal; Notable for the following components:   AST 14 (*)    Total Bilirubin 0.2 (*)    All other components within normal limits  URINALYSIS, ROUTINE W REFLEX MICROSCOPIC  PREGNANCY, URINE  CBC WITH DIFFERENTIAL/PLATELET  LIPASE, BLOOD  RPR  HIV ANTIBODY (ROUTINE TESTING W REFLEX)  GC/CHLAMYDIA PROBE AMP ([place]) NOT AT [facility]    EKG None  Radiology Ct Abdomen Pelvis W Contrast  Result Date: 01/19/2019 CLINICAL DATA:  Diffuse abdominal pain 2 days EXAM: CT ABDOMEN AND PELVIS WITH CONTRAST TECHNIQUE: Multidetector CT imaging of the abdomen and pelvis was performed using the standard  protocol following bolus administration of intravenous contrast. CONTRAST:  OMNIPAQUE IOHEXOL 300 MG/ML  SOLN COMPARISON:  06/09/2015 FINDINGS: Lower chest: No acute abnormality. Hepatobiliary: 3 x 2.2 cm right hepatic mass with peripheral enhancement and central low attenuation. No other focal hepatic mass. No gallstones, gallbladder wall thickening, or biliary dilatation. Pancreas: Unremarkable. No pancreatic ductal dilatation or surrounding inflammatory changes. Spleen: Normal in size without focal abnormality. Adrenals/Urinary Tract: Adrenal glands are unremarkable. Kidneys are normal, without renal calculi, focal lesion, or hydronephrosis. Bladder is unremarkable. Stomach/Bowel: Stomach is within normal limits. Appendix appears normal. No evidence of bowel  wall thickening, distention, or inflammatory changes. Vascular/Lymphatic: No significant vascular findings are present. No enlarged abdominal or pelvic lymph nodes. Reproductive: Uterus and bilateral adnexa are unremarkable. Other: No abdominal wall hernia or abnormality. No abdominopelvic ascites. Musculoskeletal: No acute or significant osseous findings. IMPRESSION: 1. No acute abdominal or pelvic pathology. 2. Indeterminate 3 x 2.2 cm right hepatic mass. This likely reflects focal nodular hyperplasia, but recommend further evaluation with a nonemergent liver protocol MRI of the abdomen without and with intravenous contrast for better characterization. Electronically Signed   By: Elige Ko   On: 01/19/2019 18:03    Procedures Procedures (including critical care time)  Medications Ordered in ED Medications  sodium chloride 0.9 % bolus 1,000 mL (1,000 mLs Intravenous New Bag/Given 01/19/19 1707)  morphine 4 MG/ML injection 4 mg (4 mg Intravenous Given 01/19/19 1709)  ondansetron (ZOFRAN) injection 4 mg (4 mg Intravenous Given 01/19/19 1709)  iohexol (OMNIPAQUE) 300 MG/ML solution 100 mL (100 mLs Intravenous Contrast Given 01/19/19 1720)    dicyclomine (BENTYL) capsule 20 mg (20 mg Oral Given 01/19/19 1902)     Initial Impression / Assessment and Plan / ED Course  I have reviewed the triage vital signs and the nursing notes.  Pertinent labs & imaging results that were available during my care of the patient were reviewed by me and considered in my medical decision making (see chart for details).        Patient presents with abdominal pain. Patient is nontoxic appearing, afebrile, not tachycardic, not tachypneic, not hypotensive, maintains excellent SPO2 on room air, and is in no apparent distress.  No leukocytosis.  Evidence of bacterial vaginosis with clue cells noted on wet prep.  Lab results otherwise reassuring.  STD tests pending.  No CMT or adnexal tenderness, low suspicion for PID. CT without evidence of appendicitis or other acute abnormality.  Liver lesion noted incidentally on CT.  This finding as well as the recommendation for nonemergent, outpatient MRI was discussed with the patient.  Findings and plan of care discussed with Theda Belfast, MD.   End of shift patient care handoff report given to the Britni Henderly, PA-C. Plan: Pelvic ultrasound pending, review results and disposition accordingly.  Deliver results of wet prep.  Prescribe Flagyl and Bentyl after verifying pharmacy.  Vitals:   01/19/19 1621 01/19/19 1623 01/19/19 1909  BP:  128/76 124/78  Pulse:  82 78  Resp:  18 16  Temp:  98.3 F (36.8 C) 98 F (36.7 C)  TempSrc:   Oral  SpO2:  100% 100%  Weight: 79.8 kg    Height: 5\' 9"  (1.753 m)       Final Clinical Impressions(s) / ED Diagnoses   Final diagnoses:  Periumbilical abdominal pain    ED Discharge Orders    None       Concepcion Living 01/19/19 2001    Tegeler, Canary Brim, MD 01/19/19 2053

## 2019-01-19 NOTE — Discharge Instructions (Addendum)
°  Antiinflammatory medications: Take 600 mg of ibuprofen every 6 hours or 440 mg (over the counter dose) to 500 mg (prescription dose) of naproxen every 12 hours for the next 3 days. After this time, these medications may be used as needed for pain. Take these medications with food to avoid upset stomach. Choose only one of these medications, do not take them together. Acetaminophen (generic for Tylenol): Should you continue to have additional pain while taking the ibuprofen or naproxen, you may add in acetaminophen as needed. Your daily total maximum amount of acetaminophen from all sources should be limited to 4000mg /day for persons without liver problems, or 2000mg /day for those with liver problems.  There was evidence of bacterial vaginosis on the wet prep. Please take the flagyl until finished. Do not drink alcohol with this medication.  Follow up with OBGYN for further evaluation of abnormalities found on the pelvic exam.  These included cervical erosion and mild friability (bleeding when cervix was touched with a swab).  Return to the ED for worsening pain, persistent vomiting, or any other major concerns.  Nonemergent, incidental finding noted in the liver during CT scan and detailed below: Indeterminate 3 x 2.2 cm right hepatic mass. This likely reflects  focal nodular hyperplasia, but recommend further evaluation with a  nonemergent liver protocol MRI of the abdomen without and with  intravenous contrast for better characterization.  Recommend setting up this imaging study with a primary care provider or gastroenterologist.

## 2019-01-19 NOTE — ED Notes (Signed)
Pt ambulatory to US at this time

## 2019-01-20 ENCOUNTER — Telehealth: Payer: Self-pay | Admitting: Gastroenterology

## 2019-01-20 LAB — GC/CHLAMYDIA PROBE AMP (~~LOC~~) NOT AT ARMC
Chlamydia: NEGATIVE
NEISSERIA GONORRHEA: NEGATIVE

## 2019-01-20 NOTE — Telephone Encounter (Signed)
Patient is scheduled for Webex tomorrow at 2:00

## 2019-01-20 NOTE — Telephone Encounter (Signed)
Pt mother called in and stated that [place] [facility] [place] and liver mass was found and wants her daughter to be seen. She did decline a tele visit. Pt has not seen a gi doctor. Doc of the today is [place].

## 2019-01-21 ENCOUNTER — Ambulatory Visit: Payer: BLUE CROSS/BLUE SHIELD | Admitting: Gastroenterology

## 2019-01-21 LAB — RPR: RPR Ser Ql: NONREACTIVE

## 2019-01-21 LAB — HIV ANTIBODY (ROUTINE TESTING W REFLEX): HIV Screen 4th Generation wRfx: NONREACTIVE

## 2019-01-25 ENCOUNTER — Ambulatory Visit: Payer: BLUE CROSS/BLUE SHIELD | Admitting: Gastroenterology

## 2019-01-25 ENCOUNTER — Other Ambulatory Visit: Payer: Self-pay

## 2019-01-25 DIAGNOSIS — R509 Fever, unspecified: Secondary | ICD-10-CM

## 2019-01-25 DIAGNOSIS — R160 Hepatomegaly, not elsewhere classified: Secondary | ICD-10-CM

## 2019-01-25 DIAGNOSIS — R109 Unspecified abdominal pain: Secondary | ICD-10-CM

## 2019-01-25 DIAGNOSIS — K769 Liver disease, unspecified: Secondary | ICD-10-CM

## 2019-01-25 MED ORDER — PROMETHAZINE HCL 12.5 MG PO TABS: 12.5000 mg | ORAL_TABLET | Freq: Four times a day (QID) | ORAL | 0 refills | Status: AC | PRN

## 2019-01-25 MED ORDER — PANTOPRAZOLE SODIUM 40 MG PO TBEC: 40.0000 mg | DELAYED_RELEASE_TABLET | Freq: Every day | ORAL | 3 refills | Status: AC

## 2019-01-25 NOTE — Progress Notes (Signed)
TELEHEALTH VISIT  Referring Provider: No ref. provider found Primary Care Physician:  System, Provider Not In   Tele-visit due to COVID-19 pandemic Patient requested visit virtually, consented to the virtual encounter via WebEx Contact made at: 13:30 01/25/19 Patient verified by name and date of birth Location of patient: Home Location provider: My medical office Names of persons participating: Me, patient Time spent on telehealth visit:   Reason for Consultation: Liver mass   IMPRESSION:  Acute abdominal pain x 8 days Unexplained fevers 3 x 2.2 cm right hepatic mass seen on contrasted CT scan 01/19/2019    - not seen on CT scan 2016  No obvious etiology to acute abdominal pain behind the umbilicus and unexplained fevers. Evaluated in ED with labs and CT scan 01/19/19. I've personally reviewed the CT scan and it shows no pancreatic or biliary abnormalities beyond the suspected focal nodular hyperplasia. No features to suggest abscess. Given it's size, it's unlikely to be the cause of her symptoms.   MRI of the liver recommended for definitive diagnosis.  She does not use any oral contraceptives.  Natural history of FNH reviewed with the patient.  I encouraged her to use the [employer] for additional information.  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 Asthma and seasonal allergies.  The history is obtained through the patient. She was well until 8 days ago when her symptoms acutely developed.  Severe abdominal pain behind her umbilicus last week. Stabbing and burning pain.  Hurts with movement. Fever. Moves between her umbilicus and right side.  Feels like the pain is worse than when she  went to the ED.   Persistent fever since that time. 99.5 and 102.1.  Some vomiting last night. Anorexia. Ate Cheerios last night. Alternating acetaminophen and ibuprofen. No BM in 4 days. Has diarrhea when she goes, but, will only have a BM every few days. No other associated symptoms. No identified exacerbating or relieving features.   No known sick contacts. Working from home since 01/10/19. Mostly self-isolated given the history of asthma.   Seen in the ED 01/19/2019 for acute abdominal pain.  CT scan with contrast 01/19/2019 shows a 3 x 2.5 cm right hepatic mass with peripheral enhancement and central low attenuation.  ED doc noted cervical friability on exam and made a referral to OB/GYN.  She was discharged to home on Flagyl for bacterial vaginosis and Bentyl. Developed hives after one dose of each. Has not started anything as she has been waiting for her PCP.   A CT scan 06/09/2015 notes a normal liver.  No OCP in the past or currently.   Mother with similar symptoms that ultimately required appednicits after 3 ED visit with similar symptoms.   No known family history of colon cancer or polyps. No family history of uterine/endometrial cancer, pancreatic cancer or gastric/stomach cancer.  Past Medical History:  Diagnosis Date  . Anemia   . Asthma   . Diverticulitis   . Hypoglycemia   . Migraine     No past surgical history on file.  Current Outpatient Medications  Medication Sig Dispense Refill  . ALBUTEROL IN Inhale into the lungs.    . dicyclomine (BENTYL) 20  MG tablet Take 1 tablet (20 mg total) by mouth 2 (two) times daily. 20 tablet 0  . fluticasone (FLONASE) 50 MCG/ACT nasal spray Place 2 sprays into the nose daily. 16 g 6  . ibuprofen (ADVIL,MOTRIN) 200 MG tablet Take 400 mg by mouth every 6 (six) hours as needed.    Marland Kitchen ibuprofen (ADVIL,MOTRIN) 800 MG tablet Take 1 tablet (800 mg total) by mouth 3 (three) times daily. 21 tablet 0  . Loteprednol-Tobramycin (ZYLET OP) Apply to  eye.    . metroNIDAZOLE (FLAGYL) 500 MG tablet Take 1 tablet (500 mg total) by mouth 2 (two) times daily. 14 tablet 0   No current facility-administered medications for this visit.     Allergies as of 01/25/2019 - Review Complete 01/19/2019  Allergen Reaction Noted  . Doxycycline Anaphylaxis 01/19/2019  . Penicillins Anaphylaxis 02/06/2012  . Amoxicillin Hives 02/06/2012  . Cephalexin Hives 02/06/2012  . Erythromycin Nausea And Vomiting 02/06/2012    Family History  Problem Relation Age of Onset  . Hodgkin's lymphoma Mother   . Cancer Mother        breast, thyroid    Social History   Socioeconomic History  . Marital status: Single    Spouse name: Not on file  . Number of children: Not on file  . Years of education: Not on file  . Highest education level: Not on file  Occupational History  . Not on file  Social Needs  . Financial resource strain: Not on file  . Food insecurity:    Worry: Not on file    Inability: Not on file  . Transportation needs:    Medical: Not on file    Non-medical: Not on file  Tobacco Use  . Smoking status: Current Every Day Smoker    Packs/day: 0.50    Types: Cigarettes  . Smokeless tobacco: Never Used  Substance and Sexual Activity  . Alcohol use: No    Comment: 3 drinks/week  . Drug use: No  . Sexual activity: Not on file  Lifestyle  . Physical activity:    Days per week: Not on file    Minutes per session: Not on file  . Stress: Not on file  Relationships  . Social connections:    Talks on phone: Not on file    Gets together: Not on file    Attends religious service: Not on file    Active member of club or organization: Not on file    Attends meetings of clubs or organizations: Not on file    Relationship status: Not on file  . Intimate partner violence:    Fear of current or ex partner: Not on file    Emotionally abused: Not on file    Physically abused: Not on file    Forced sexual activity: Not on file  Other Topics  Concern  . Not on file  Social History Narrative  . Not on file    Review of Systems: ALL ROS discussed and all others negative except listed in HPI.  Physical Exam: General: in no acute distress Neuro: Alert and appropriate Psych: Normal affect and normal insight Exam limited due to [employer]. Orvan Falconer, MD, MPH  Gastroenterology 01/25/2019, 12:52 PM

## 2019-01-25 NOTE — Patient Instructions (Addendum)
I have recommended an MRI of the liver for more evaluation of the suspected focal nodular hyperplasia.  Please have some labs checked this week: CBC with diff, liver enzymes, lipase enzymes.  Trial of PPI therapy - pantoprazole 40 mg daily.   Trial of phenergan 12.5 mg q6 hours as needed for nausea.  Continue with the Bentyl up to four times daily to control your pain.   Please call with a symptoms update in one week, or earlier as needed.  The [employer] has a helpful website with accurate information about Focal Nodular Hyperplasia.

## 2019-01-26 ENCOUNTER — Telehealth: Payer: Self-pay | Admitting: Gastroenterology

## 2019-01-26 DIAGNOSIS — R1013 Epigastric pain: Secondary | ICD-10-CM | POA: Diagnosis not present

## 2019-01-26 DIAGNOSIS — L03316 Cellulitis of umbilicus: Secondary | ICD-10-CM | POA: Diagnosis not present

## 2019-01-26 NOTE — Telephone Encounter (Signed)
Pt states the she noticed this morning a discharge from her belly button and a mass that is bright red. Would like a call back.

## 2019-01-26 NOTE — Telephone Encounter (Signed)
Spoke with patient she states she has had continued abdominal pain and a discharge from her belly button. So much that her shirt was sticking to her skin. Patient denies seeing any blood but this is like a red/pink bubble.

## 2019-01-26 NOTE — Telephone Encounter (Signed)
She states she will try her PCP or go to urgent care to be further evaluated.

## 2019-01-26 NOTE — Telephone Encounter (Signed)
I am sorry that she is having so much trouble. She should be seen in a clinic where she can be evaluated fully evaluated for the "discharge" and the "mass." If her PCP is seeing patients, that would be a place to start. Otherwise, I recommend an Urgent Care. Thank you.

## 2019-01-27 ENCOUNTER — Telehealth: Payer: Self-pay | Admitting: Gastroenterology

## 2019-01-27 NOTE — Telephone Encounter (Signed)
On call note @ 1845. Pt called with swelling and redness at her navel that started today. She has ongoing abdominal pain that has not changed. She had a telemedicine visit with Dr. Orvan Falconer yesterday for abdominal pain.  Patient called Dr. Orvan Falconer today with her new symptoms and was advised to visit her PCP or an urgent care for evaluation. She was evaluated at [facility] urgent care and she relates a skin infection at her navel was diagnosed and she wanted our opinion.  Advice: without benefit of examining her I would defer to the provider who examined her today. Take antibiotics as prescribed and follow other directions as provided at [facility] urgent care today. If her navel symptoms do not resolve or worsen contact the physician/office who evaluated her in person today. Follow other plans as recommended by Dr. Orvan Falconer at her telemedicine visit.

## 2019-01-28 ENCOUNTER — Encounter: Payer: Self-pay | Admitting: Gastroenterology

## 2019-03-14 ENCOUNTER — Other Ambulatory Visit: Payer: BLUE CROSS/BLUE SHIELD

## 2019-04-05 DIAGNOSIS — R51 Headache: Secondary | ICD-10-CM | POA: Diagnosis not present

## 2019-04-06 DIAGNOSIS — Z20828 Contact with and (suspected) exposure to other viral communicable diseases: Secondary | ICD-10-CM | POA: Diagnosis not present

## 2019-04-13 ENCOUNTER — Ambulatory Visit
Admission: RE | Admit: 2019-04-13 | Discharge: 2019-04-13 | Disposition: A | Discharge status: Home / Self Care | Payer: BLUE CROSS/BLUE SHIELD | Source: Ambulatory Visit | Attending: Gastroenterology | Admitting: Gastroenterology

## 2019-04-13 DIAGNOSIS — K7689 Other specified diseases of liver: Secondary | ICD-10-CM | POA: Diagnosis not present

## 2019-04-13 DIAGNOSIS — K769 Liver disease, unspecified: Secondary | ICD-10-CM

## 2019-04-13 MED ORDER — GADOXETATE DISODIUM 0.25 MMOL/ML IV SOLN
8.0000 mL | Freq: Once | INTRAVENOUS | Status: AC | PRN
  Administered 2019-04-13: 8 mL via INTRAVENOUS

## 2019-05-13 DIAGNOSIS — Z03818 Encounter for observation for suspected exposure to other biological agents ruled out: Secondary | ICD-10-CM | POA: Diagnosis not present

## 2019-07-22 DIAGNOSIS — Z20828 Contact with and (suspected) exposure to other viral communicable diseases: Secondary | ICD-10-CM | POA: Diagnosis not present

## 2019-08-08 DIAGNOSIS — L309 Dermatitis, unspecified: Secondary | ICD-10-CM | POA: Diagnosis not present

## 2019-08-10 ENCOUNTER — Encounter (HOSPITAL_COMMUNITY): Payer: Self-pay | Admitting: *Deleted

## 2019-08-10 ENCOUNTER — Other Ambulatory Visit: Payer: Self-pay

## 2019-08-10 DIAGNOSIS — Z5321 Procedure and treatment not carried out due to patient leaving prior to being seen by health care provider: Secondary | ICD-10-CM | POA: Diagnosis not present

## 2019-08-10 DIAGNOSIS — T7840XA Allergy, unspecified, initial encounter: Secondary | ICD-10-CM | POA: Diagnosis not present

## 2019-08-10 NOTE — ED Triage Notes (Signed)
Pt arrives via POV with c/o allergic reaction after receiving a flu shot on her left arm. Stating rash started just below the area of injection then spread to the chest and back area. She was having some wheezing. She took liquid benadryl 50mg  and used her inhaler. Reports rash is improving, no SOB, lungs are clear, NAD.

## 2019-08-11 ENCOUNTER — Emergency Department (HOSPITAL_COMMUNITY)
Admission: EM | Admit: 2019-08-11 | Discharge: 2019-08-11 | Disposition: A | Discharge status: Home / Self Care | Payer: BC Managed Care – PPO | Attending: Emergency Medicine | Admitting: Emergency Medicine

## 2019-10-25 ENCOUNTER — Ambulatory Visit: Payer: Self-pay | Attending: Internal Medicine

## 2019-10-25 DIAGNOSIS — R238 Other skin changes: Secondary | ICD-10-CM

## 2019-10-25 DIAGNOSIS — U071 COVID-19: Secondary | ICD-10-CM

## 2019-10-26 LAB — NOVEL CORONAVIRUS, NAA: SARS-CoV-2, NAA: NOT DETECTED

## 2019-12-08 ENCOUNTER — Ambulatory Visit: Payer: Self-pay | Attending: Internal Medicine

## 2019-12-08 DIAGNOSIS — Z20822 Contact with and (suspected) exposure to covid-19: Secondary | ICD-10-CM | POA: Insufficient documentation

## 2019-12-10 LAB — NOVEL CORONAVIRUS, NAA: SARS-CoV-2, NAA: NOT DETECTED

## 2020-02-09 ENCOUNTER — Emergency Department (HOSPITAL_COMMUNITY)
Admission: EM | Admit: 2020-02-09 | Discharge: 2020-02-10 | Disposition: A | Discharge status: Home / Self Care | Payer: Self-pay | Attending: Emergency Medicine | Admitting: Emergency Medicine

## 2020-02-09 ENCOUNTER — Other Ambulatory Visit: Payer: Self-pay

## 2020-02-09 ENCOUNTER — Encounter (HOSPITAL_COMMUNITY): Payer: Self-pay

## 2020-02-09 DIAGNOSIS — Z5321 Procedure and treatment not carried out due to patient leaving prior to being seen by health care provider: Secondary | ICD-10-CM | POA: Insufficient documentation

## 2020-02-09 LAB — URINALYSIS, ROUTINE W REFLEX MICROSCOPIC
Bilirubin Urine: NEGATIVE
Glucose, UA: NEGATIVE mg/dL
Hgb urine dipstick: NEGATIVE
Ketones, ur: NEGATIVE mg/dL
Leukocytes,Ua: NEGATIVE
Nitrite: NEGATIVE
Protein, ur: NEGATIVE mg/dL
Specific Gravity, Urine: 1.003 — ABNORMAL LOW (ref 1.005–1.030)
pH: 7 (ref 5.0–8.0)

## 2020-02-09 LAB — CBC
HCT: 41.2 % (ref 36.0–46.0)
Hemoglobin: 13.6 g/dL (ref 12.0–15.0)
MCH: 29.1 pg (ref 26.0–34.0)
MCHC: 33 g/dL (ref 30.0–36.0)
MCV: 88 fL (ref 80.0–100.0)
Platelets: 303 10*3/uL (ref 150–400)
RBC: 4.68 MIL/uL (ref 3.87–5.11)
RDW: 12.3 % (ref 11.5–15.5)
WBC: 6.4 10*3/uL (ref 4.0–10.5)
nRBC: 0 % (ref 0.0–0.2)

## 2020-02-09 LAB — BASIC METABOLIC PANEL
Anion gap: 9 (ref 5–15)
BUN: 8 mg/dL (ref 6–20)
CO2: 24 mmol/L (ref 22–32)
Calcium: 9.4 mg/dL (ref 8.9–10.3)
Chloride: 107 mmol/L (ref 98–111)
Creatinine, Ser: 0.71 mg/dL (ref 0.44–1.00)
GFR calc Af Amer: >60 mL/min (ref >60)
GFR calc non Af Amer: >60 mL/min (ref >60)
Glucose, Bld: 100 mg/dL — ABNORMAL HIGH (ref 70–99)
Potassium: 3.9 mmol/L (ref 3.5–5.1)
Sodium: 140 mmol/L (ref 135–145)

## 2020-02-09 LAB — I-STAT BETA HCG BLOOD, ED (MC, WL, AP ONLY): I-stat hCG, quantitative: <5 m[IU]/mL (ref <5)
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
## 2020-02-10 NOTE — ED Notes (Signed)
Pt called for a room no answer x2

## 2020-02-10 NOTE — ED Notes (Signed)
Pt step outside 

## 2020-10-20 IMAGING — US TRANSVAGINAL ULTRASOUND OF PELVIS
1 series · 13 of 25 positions shown · non-contrast
Comparison: None.

CLINICAL DATA: Pelvic pain x2 days.

EXAM:
TRANSABDOMINAL AND TRANSVAGINAL ULTRASOUND OF PELVIS
DOPPLER ULTRASOUND OF OVARIES
TECHNIQUE: Both transabdominal and transvaginal ultrasound examinations of the
pelvis were performed. Transabdominal technique was performed for
global imaging of the pelvis including uterus, ovaries, adnexal
regions, and pelvic cul-de-sac.
It was necessary to proceed with endovaginal exam following the
transabdominal exam to visualize the right ovary. Color and duplex
Doppler ultrasound was utilized to evaluate blood flow to the
ovaries.

[Series 1: transvaginal ultrasound of pelvis · 13 of 56 slices shown]
[im 1/56]
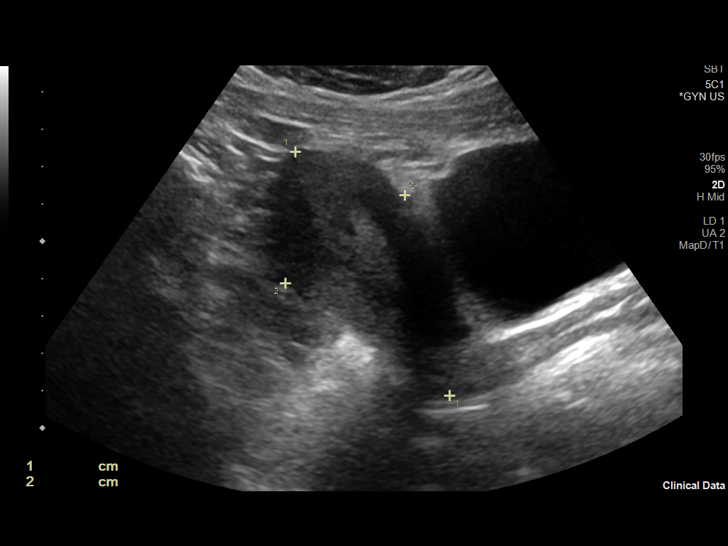
[im 5/56]
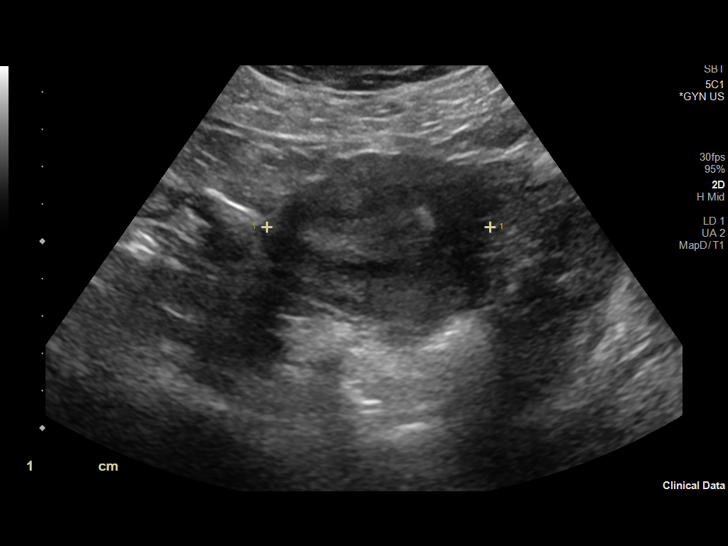
[im 10/56]
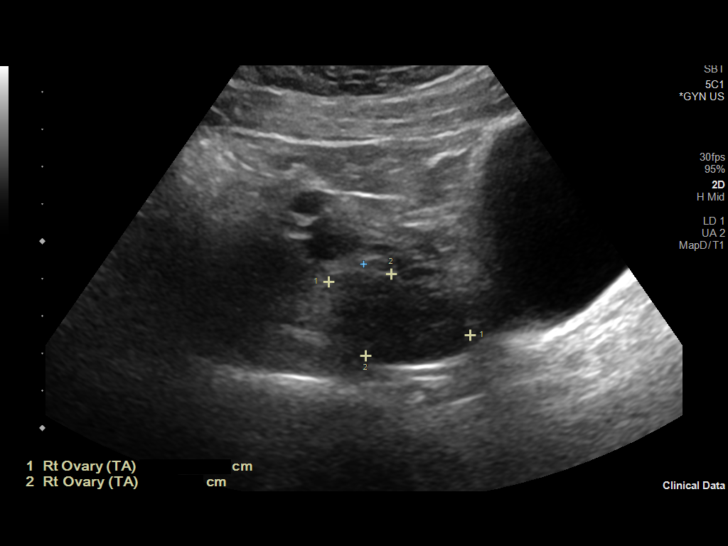
[im 14/56]
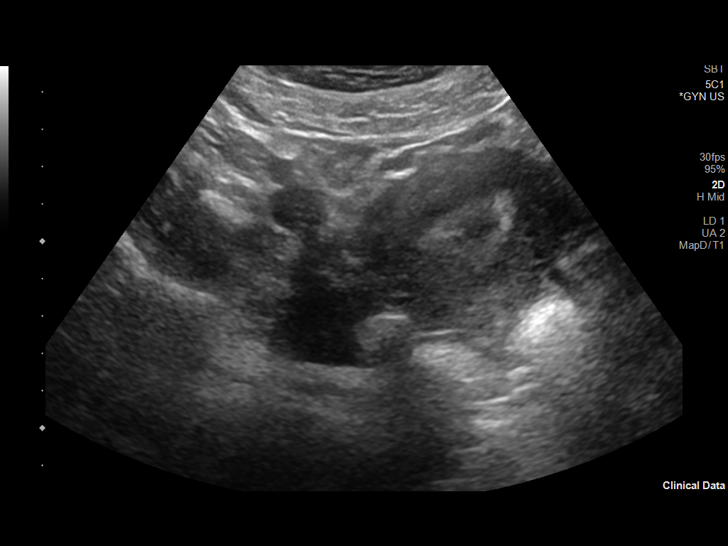
[im 19/56]
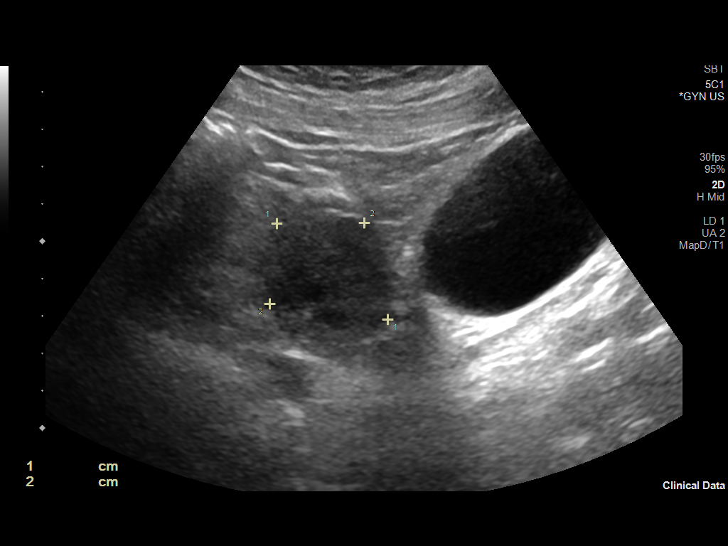
[im 23/56]
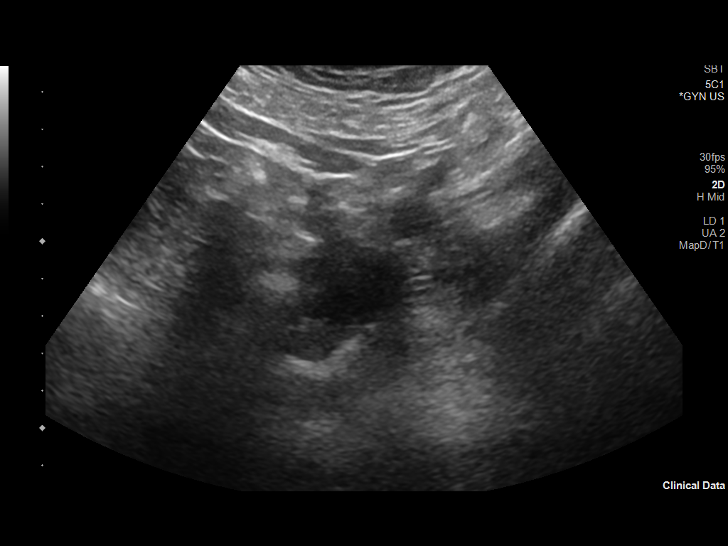
[im 28/56]
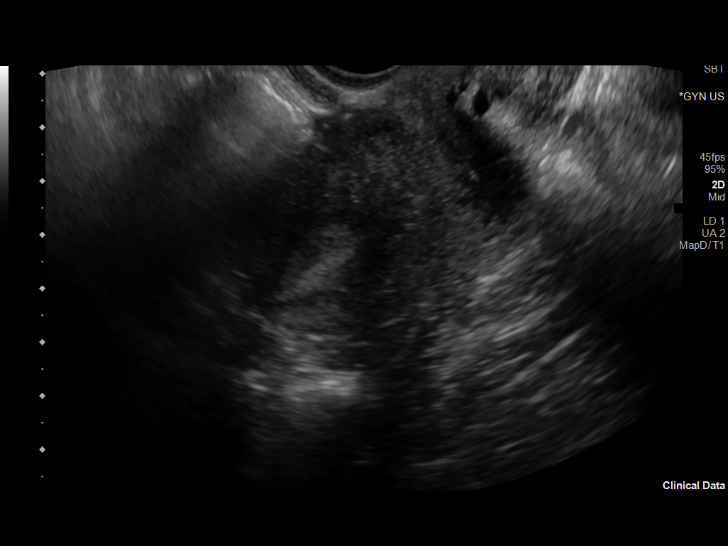
[im 33/56]
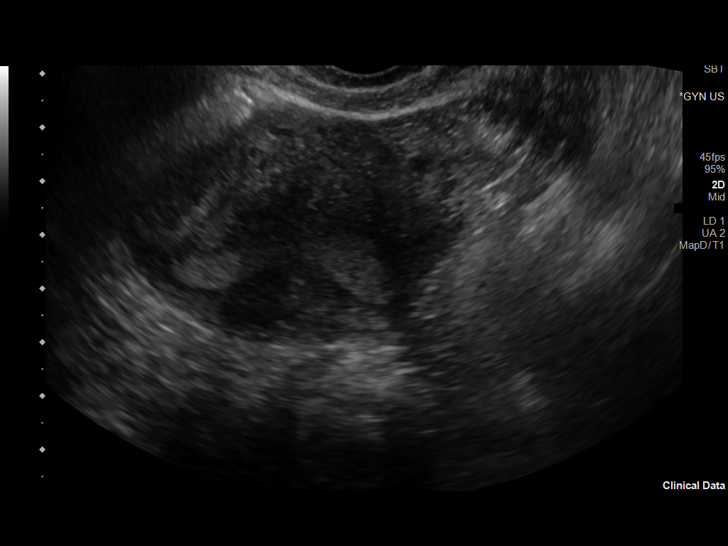
[im 37/56]
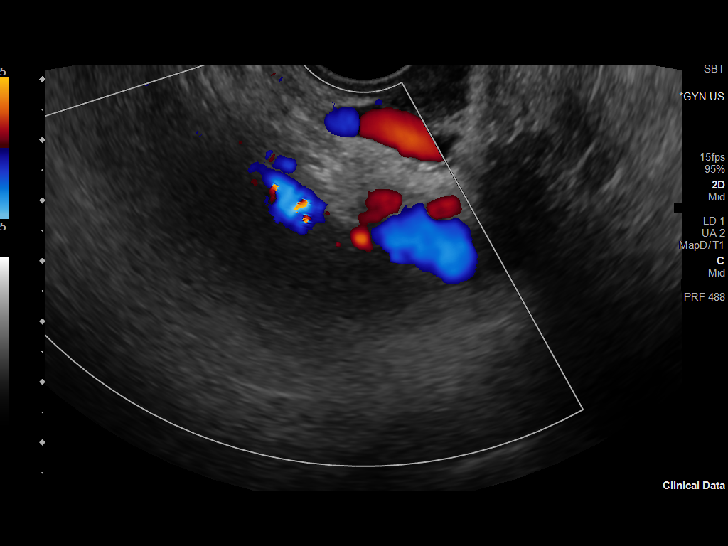
[im 42/56]
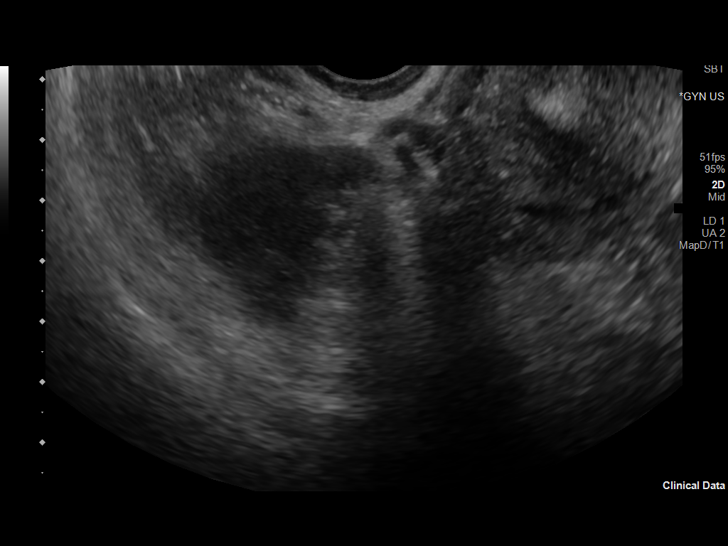
[im 46/56]
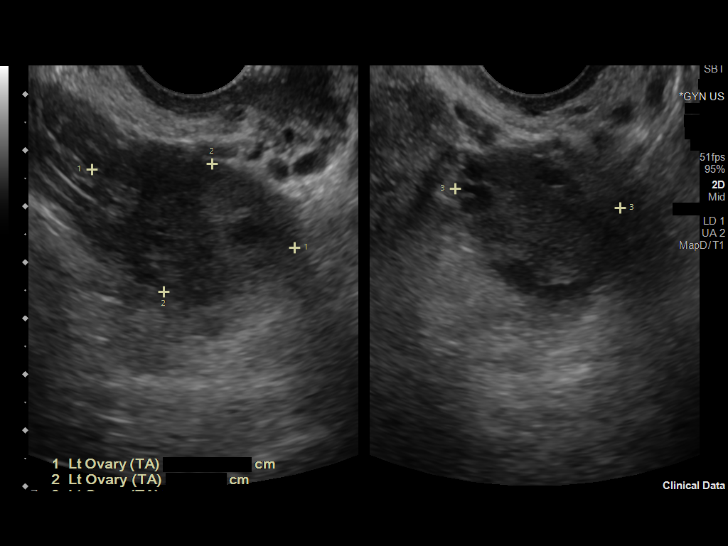
[im 51/56]
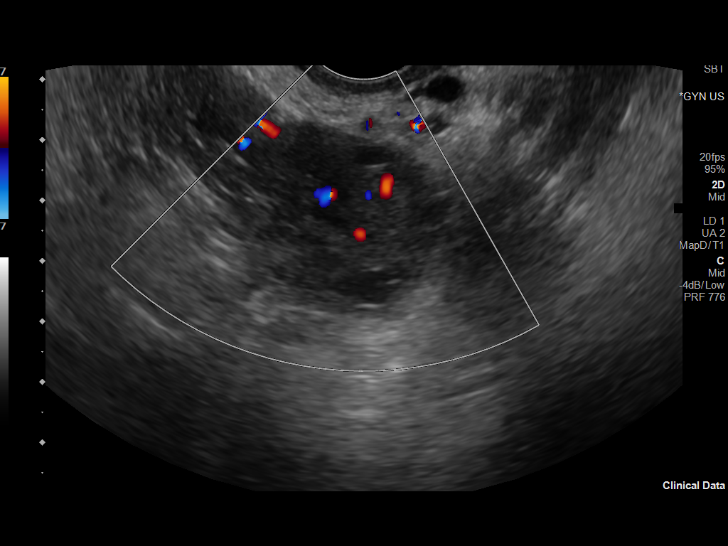
[im 56/56]
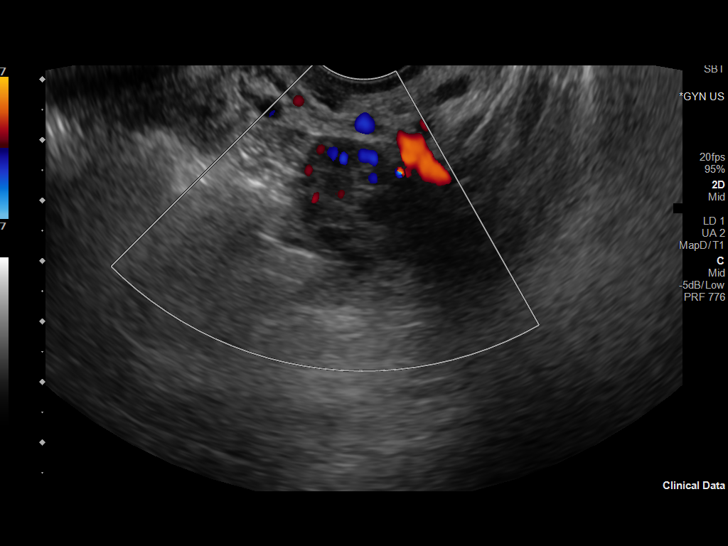

[13 of 25 positions shown; findings below may reference images not displayed]

FINDINGS: Uterus

Measurements: 8.7 x 4.3 x 5.7 cm = volume: 110 mL. No fibroids or
other mass visualized.

Endometrium

Thickness: 17. Slight separation of the endometrial cavity may
represent presence of an arcuate uterus, less likely septate or
subseptate configuration given lack of apparent septum.

Right ovary

Measurements: 4.1 x 2.3 x 3.2 cm = volume: 16 mL. Normal
appearance/no adnexal mass.

Left ovary

Measurements: 3.9 x 2.4 x 3 cm = volume: 15 mL. Normal appearance/no
adnexal mass.

Pulsed Doppler evaluation of both ovaries demonstrates normal
low-resistance arterial and venous waveforms.

Other findings

No abnormal free fluid.
IMPRESSION: No adnexal mass or torsion.

Possible mild arcuate configuration of the endometrium.

## 2020-10-20 IMAGING — CT CT ABDOMEN AND PELVIS WITH CONTRAST
2 of 4 series · 16 of 46 positions shown, 18 images · IV contrast (APPLIED)
Comparison: 06/09/2015

CLINICAL DATA: Diffuse abdominal pain 2 days

EXAM:
CT ABDOMEN AND PELVIS WITH CONTRAST
TECHNIQUE: Multidetector CT imaging of the abdomen and pelvis was performed
using the standard protocol following bolus administration of
intravenous contrast.
CONTRAST:  100mL OMNIPAQUE IOHEXOL 300 MG/ML  SOLN

[Series 2: axial st · axial · 0.73mm/px · z∈[-532,-67]mm · 13 of 103 slices shown, 15 images]
[im 5/103  soft-tissue]
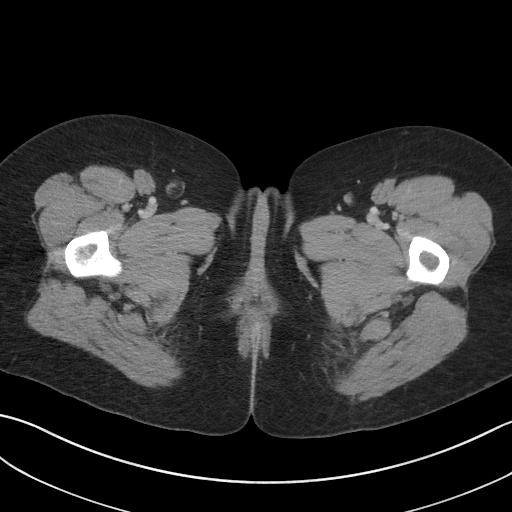
[im 5/103  bone]
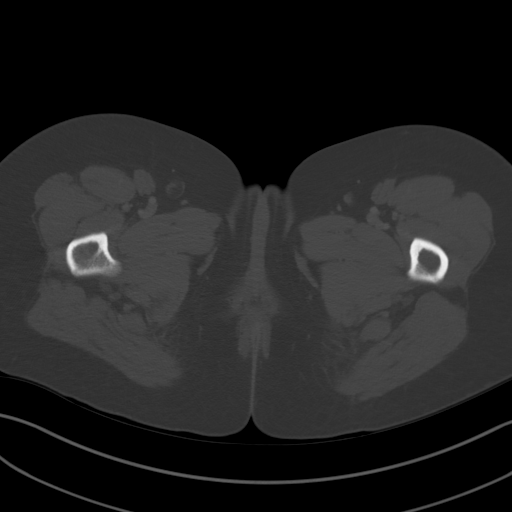
[im 13/103  soft-tissue]
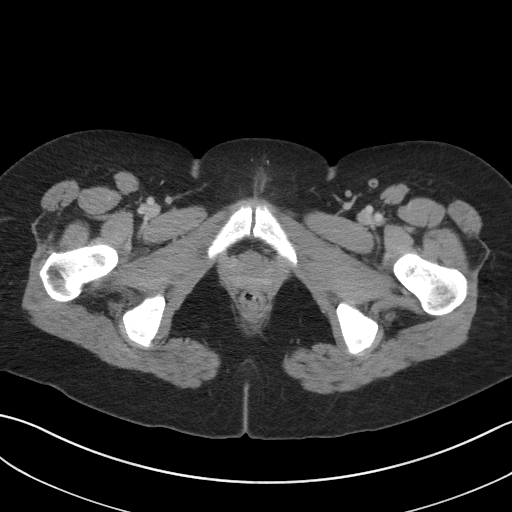
[im 21/103  soft-tissue]
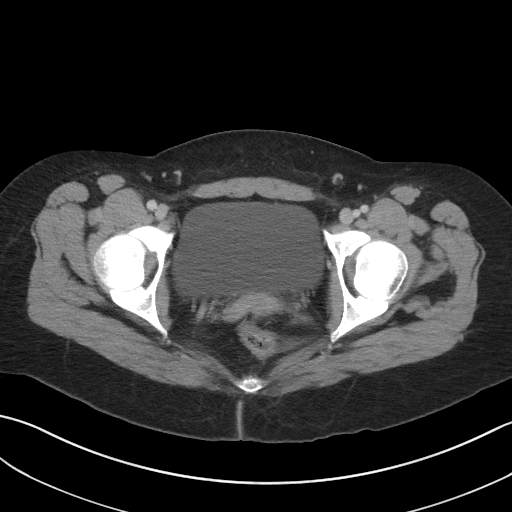
[im 29/103  soft-tissue]
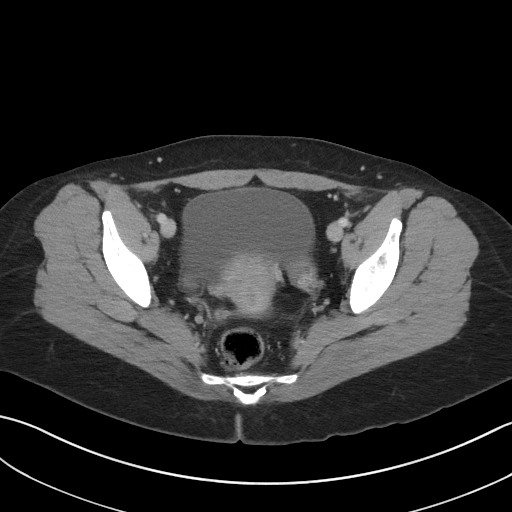
[im 37/103  soft-tissue]
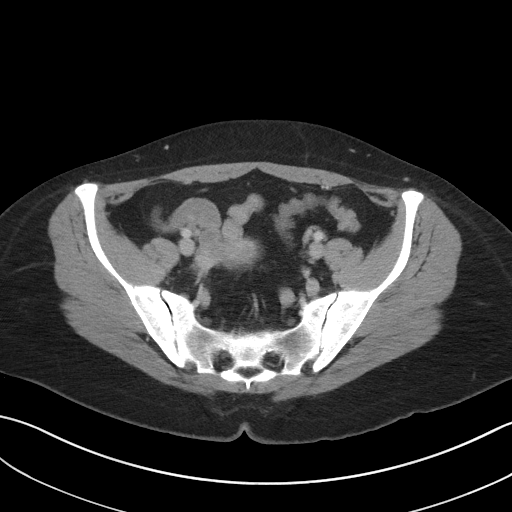
[im 45/103  soft-tissue]
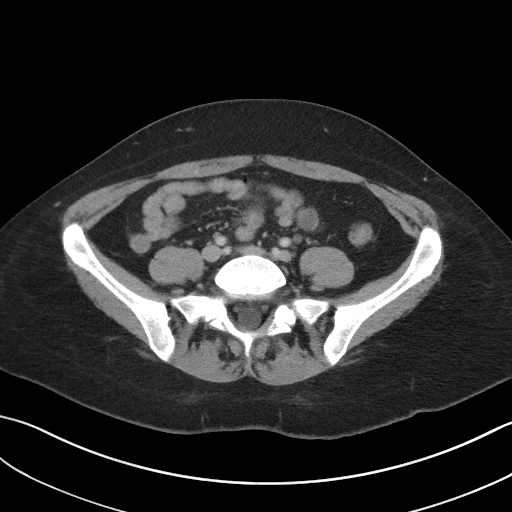
[im 54/103  soft-tissue]
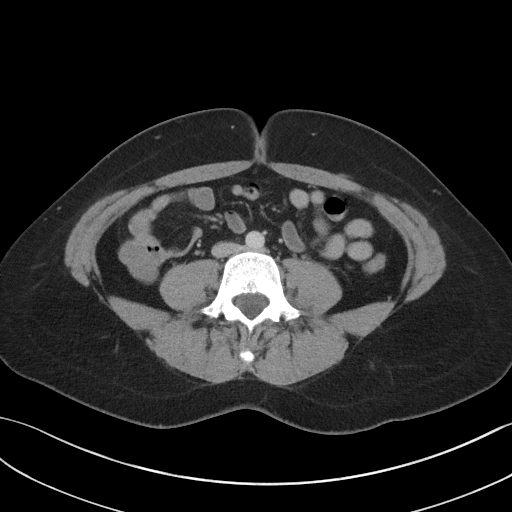
[im 58/103  soft-tissue]
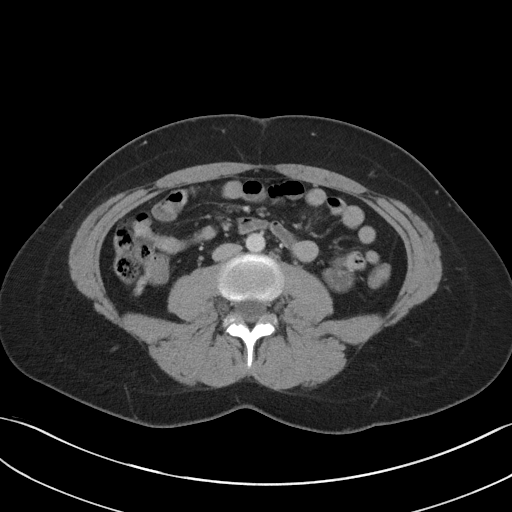
[im 66/103  soft-tissue]
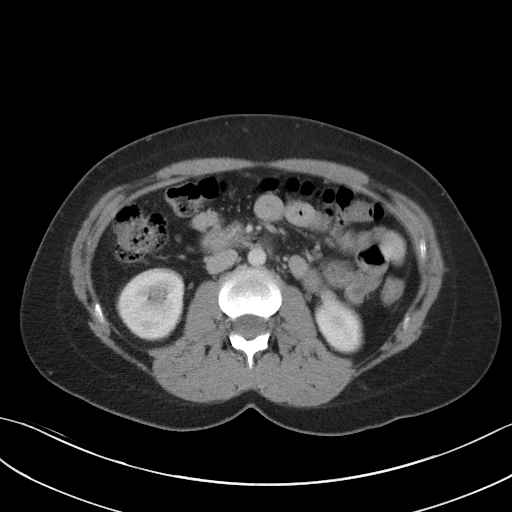
[im 66/103  bone]
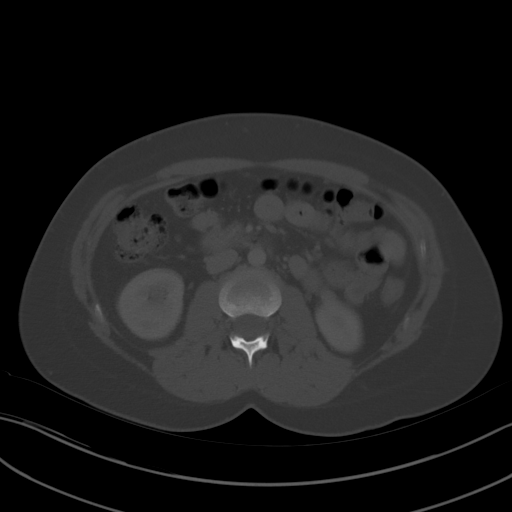
[im 74/103  soft-tissue]
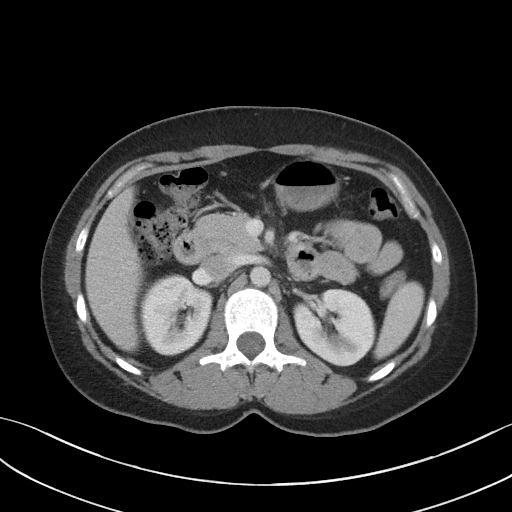
[im 82/103  soft-tissue]
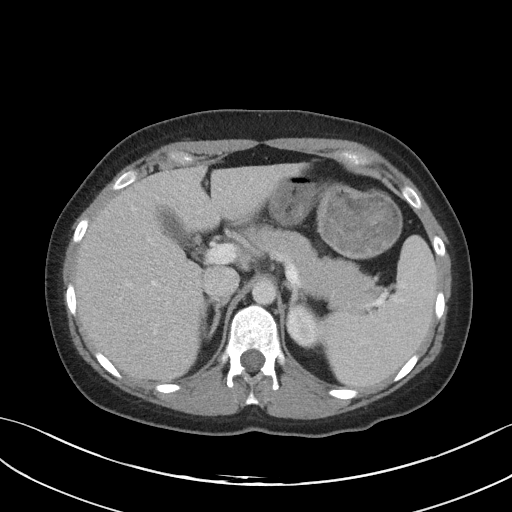
[im 90/103  soft-tissue]
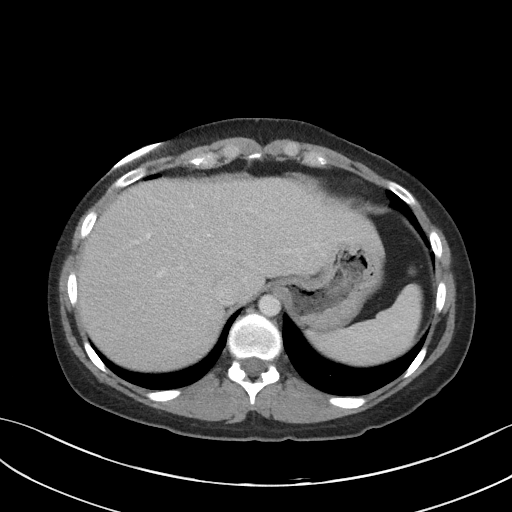
[im 98/103  soft-tissue]
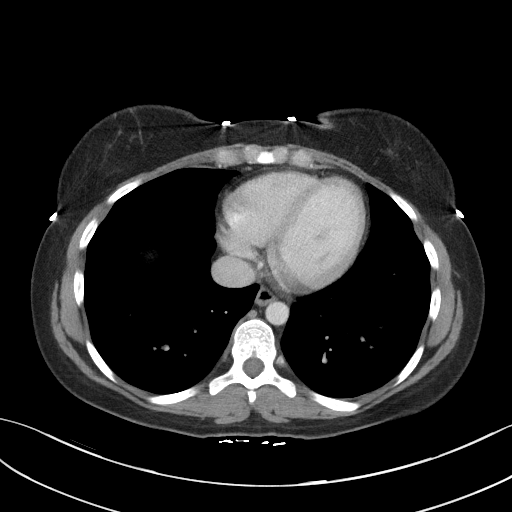

[Series 5: coronal st · coronal · 0.76mm/px · 3 of 86 slices shown]
[im 29/86  soft-tissue]
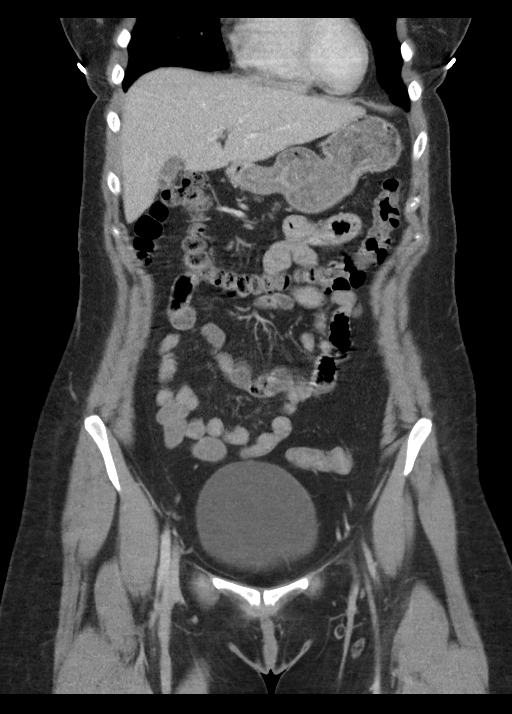
[im 38/86  soft-tissue]
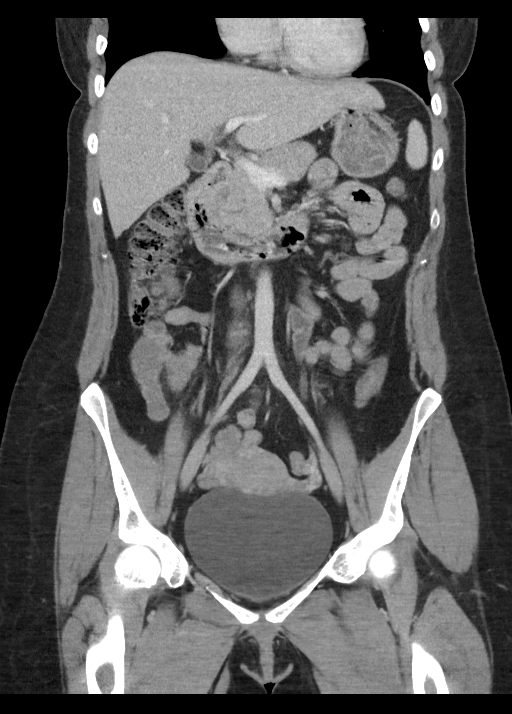
[im 48/86  soft-tissue]
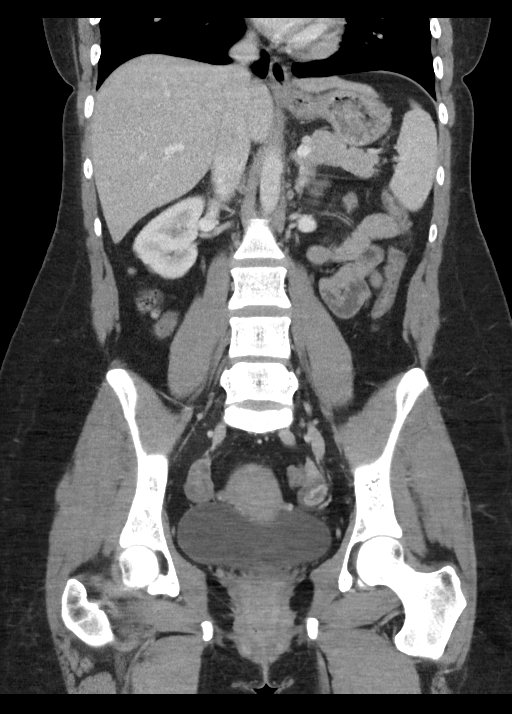

[16 of 46 positions shown; findings below may reference images not displayed]

FINDINGS: Lower chest: No acute abnormality.

Hepatobiliary: 3 x 2.2 cm right hepatic mass with peripheral
enhancement and central low attenuation. No other focal hepatic
mass. No gallstones, gallbladder wall thickening, or biliary
dilatation.

Pancreas: Unremarkable. No pancreatic ductal dilatation or
surrounding inflammatory changes.

Spleen: Normal in size without focal abnormality.

Adrenals/Urinary Tract: Adrenal glands are unremarkable. Kidneys are
normal, without renal calculi, focal lesion, or hydronephrosis.
Bladder is unremarkable.

Stomach/Bowel: Stomach is within normal limits. Appendix appears
normal. No evidence of bowel wall thickening, distention, or
inflammatory changes.

Vascular/Lymphatic: No significant vascular findings are present. No
enlarged abdominal or pelvic lymph nodes.

Reproductive: Uterus and bilateral adnexa are unremarkable.

Other: No abdominal wall hernia or abnormality. No abdominopelvic
ascites.

Musculoskeletal: No acute or significant osseous findings.
IMPRESSION: 1. No acute abdominal or pelvic pathology.
2. Indeterminate 3 x 2.2 cm right hepatic mass. This likely reflects
focal nodular hyperplasia, but recommend further evaluation with a
nonemergent liver protocol MRI of the abdomen without and with
intravenous contrast for better characterization.

## 2021-01-12 IMAGING — MR MRI ABDOMEN WITH AND WITHOUT CONTRAST
10 of 19 series · 22 of 48 positions shown · IV contrast (8ml Eovist)
Comparison: CT of 01/19/2019

CLINICAL DATA: Liver lesion on CT. Smoker. No history of primary
malignancy.

EXAM:
MRI ABDOMEN WITHOUT AND WITH CONTRAST
TECHNIQUE: Multiplanar multisequence MR imaging of the abdomen was performed
both before and after the administration of intravenous contrast.
CONTRAST:  8mL EOVIST GADOXETATE DISODIUM 0.25 MOL/L IV SOLN

[Series 4: cor haste · coronal · 4.5mm · 0.74mm/px · 1 of 36 slices shown]
[im 1/36]
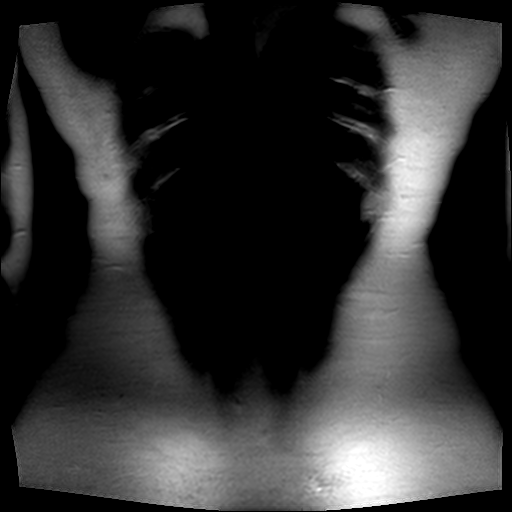

[Series 5: bSSFP · axial · 3.5mm · 0.74mm/px · z∈[-77,+134]mm · 2 of 56 slices shown]
[im 1/56]
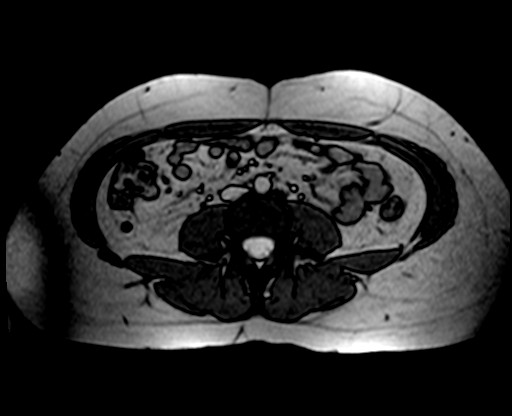
[im 56/56]
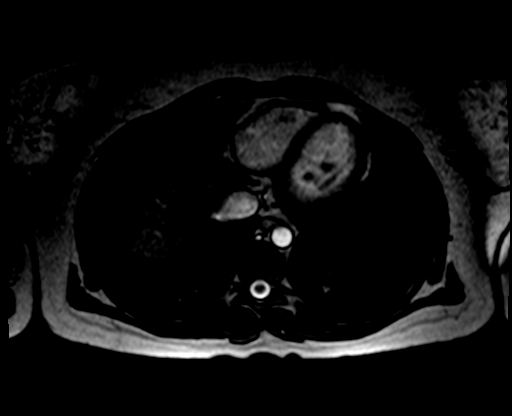

[Series 6: T1 · axial · 6.0mm · 0.74mm/px · z∈[-83,+140]mm · 2 of 64 slices shown]
[im 1/64]
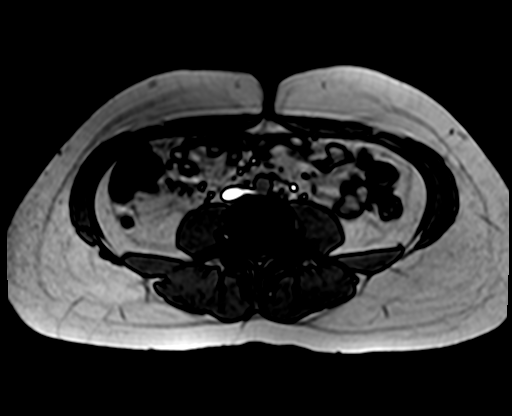
[im 64/64]
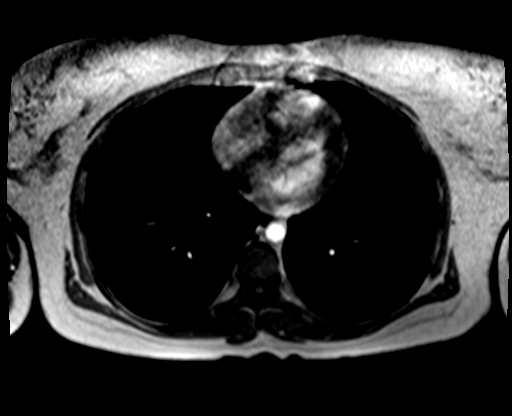

[Series 7: axial haste · axial · 5.0mm · 0.70mm/px · 1 of 38 slices shown]
[im 1/38]
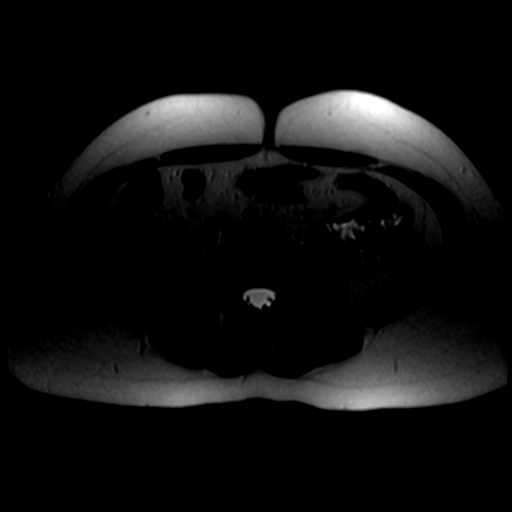

[Series 8: T1 dynamic · axial · non-contrast · 2.6mm · 0.78mm/px · z∈[-85,+142]mm · 3 of 88 slices shown]
[im 1/88]
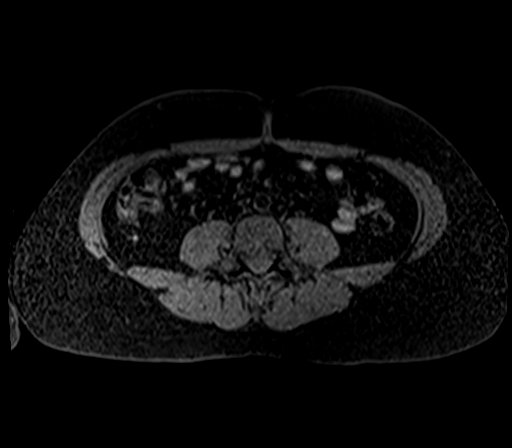
[im 44/88]
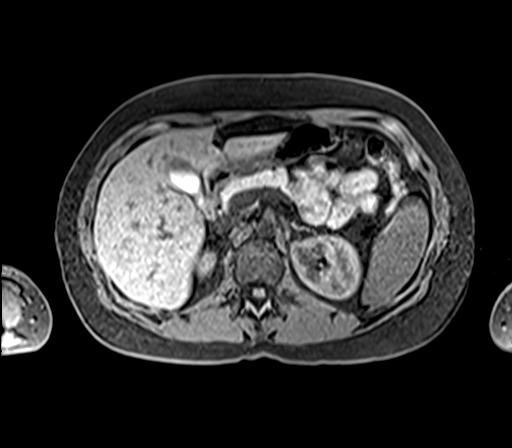
[im 88/88]
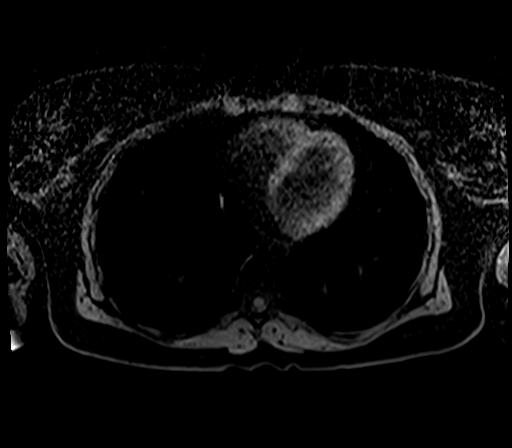

[Series 9: T1 dynamic post-contrast · axial · 2.6mm · 0.78mm/px · z∈[-85,+142]mm · 3 of 88 slices shown (1 of 5)]
[im 1/88]
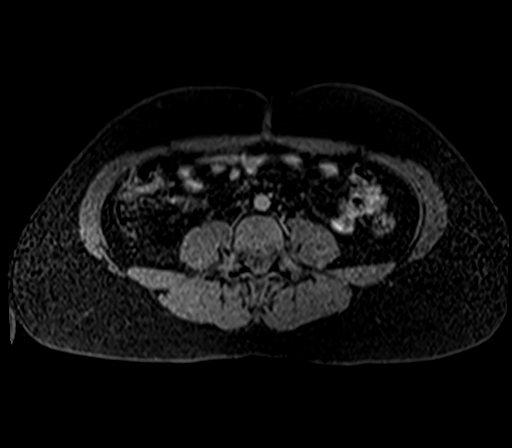
[im 44/88]
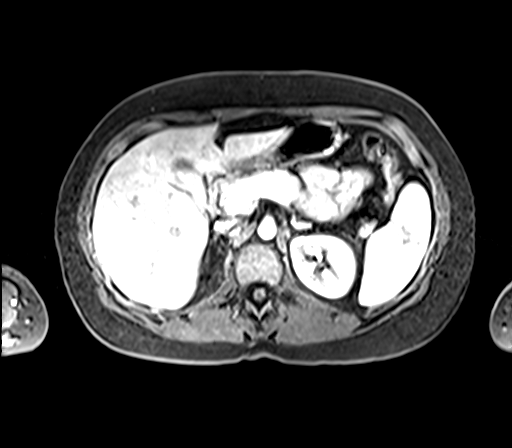
[im 88/88]
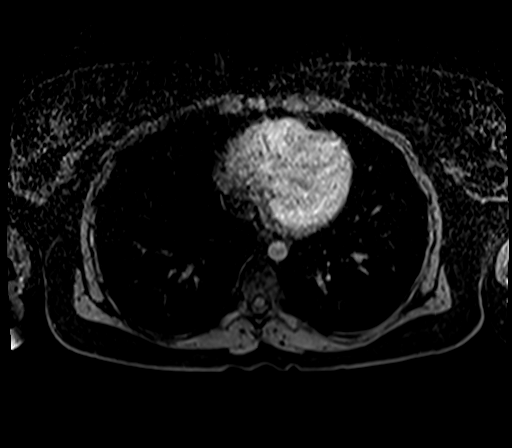

[Series 10: T1 dynamic post-contrast · axial · 2.6mm · 0.78mm/px · z∈[-85,+142]mm · 3 of 88 slices shown (2 of 5)]
[im 1/88]
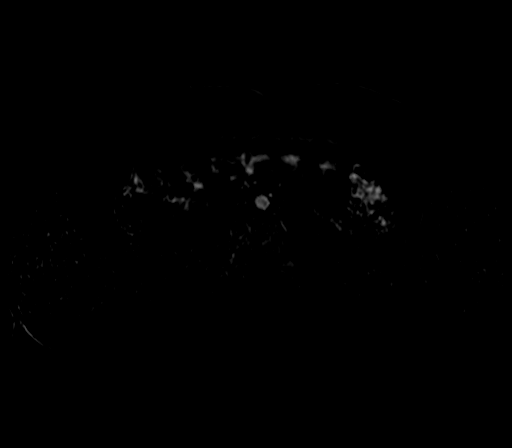
[im 44/88]
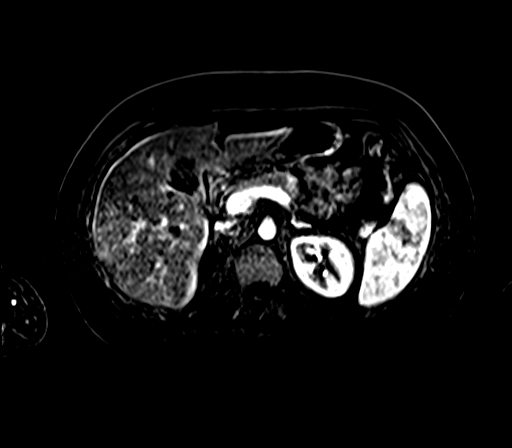
[im 88/88]
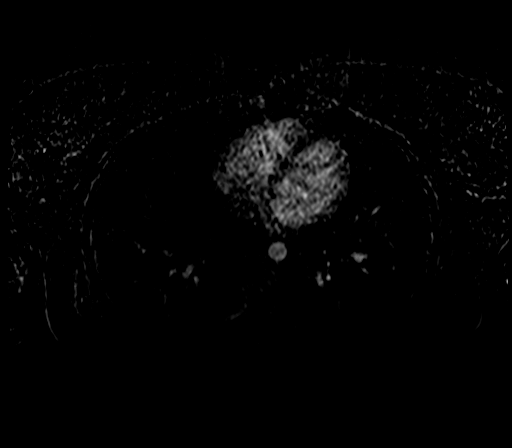

[Series 11: T1 dynamic post-contrast · axial · 2.6mm · 0.78mm/px · z∈[-85,+142]mm · 3 of 88 slices shown (3 of 5)]
[im 1/88]
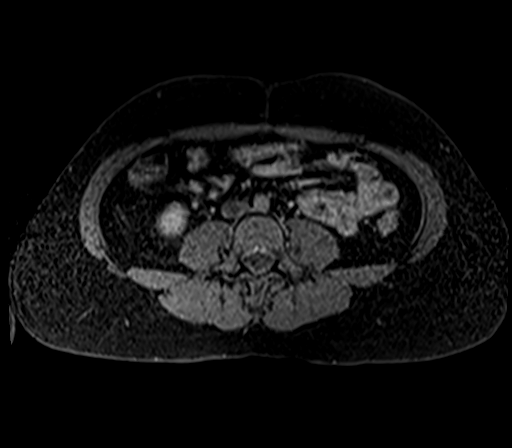
[im 44/88]
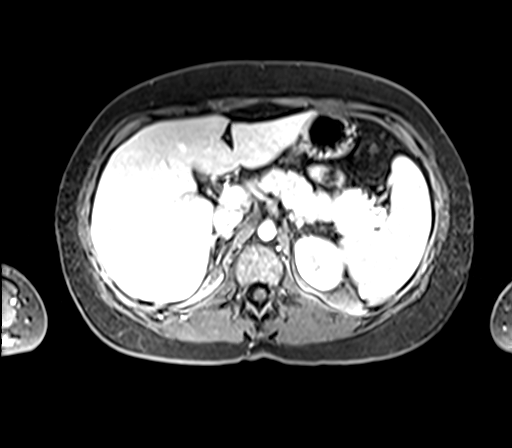
[im 88/88]
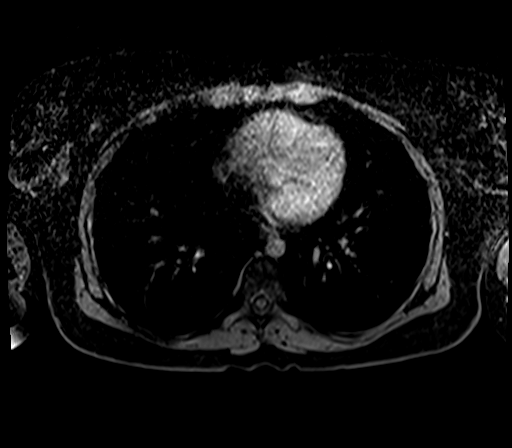

[Series 12: T1 dynamic post-contrast · axial · 2.6mm · 0.78mm/px · z∈[-85,+142]mm · 3 of 88 slices shown (4 of 5)]
[im 1/88]
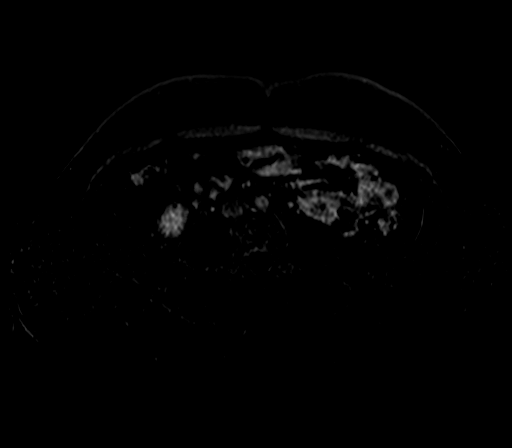
[im 44/88]
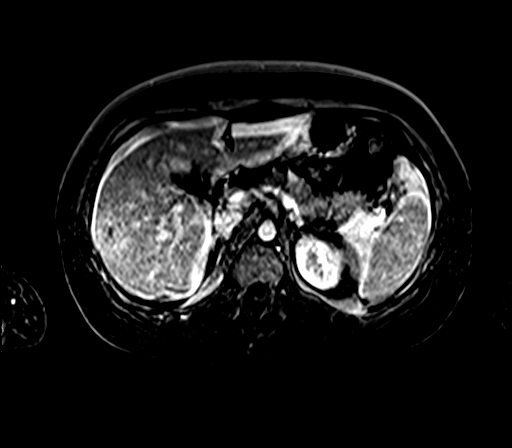
[im 88/88]
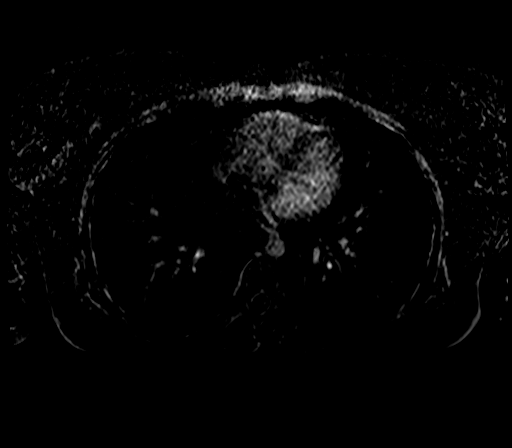

[Series 13: T1 dynamic post-contrast · axial · 2.6mm · 0.78mm/px · 1 of 88 slices shown (5 of 5)]
[im 1/88]
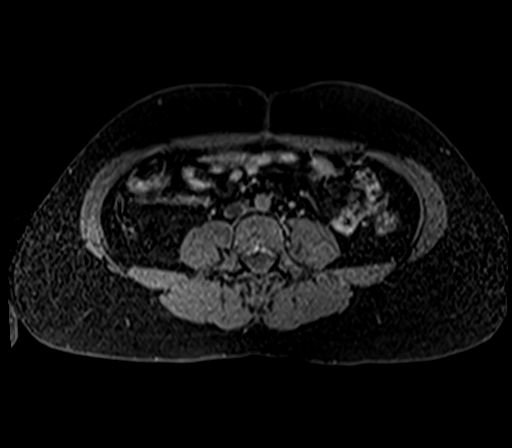

[22 of 48 positions shown; findings below may reference images not displayed]

FINDINGS: Lower chest: Normal heart size without pericardial or pleural
effusion.

Hepatobiliary: No evidence of cirrhosis. Within the subcapsular
portion of segment 6, a 3.1 x 2.9 cm arterially hyperenhancing
lesion is identified on image 39/9. This demonstrates central T2
hyperintensity on [DATE] and absent enhancement (central scar).
Relatively isoenhancing to the remainder of the liver on portal
venous phase imaging. Demonstrates subtle peripheral
hyperenhancement on hepatobiliary phase imaging, including 38/23 and
38/22.

No other liver lesions. Normal gallbladder, without biliary ductal
dilatation.

Pancreas:  Normal, without mass or ductal dilatation.

Spleen:  Normal in size, without focal abnormality.

Adrenals/Urinary Tract: Normal adrenal glands. Normal kidneys,
without hydronephrosis.

Stomach/Bowel: Normal stomach and abdominal bowel loops.

Vascular/Lymphatic: Normal caliber of the aorta and branch vessels.
No abdominal adenopathy.

Other:  No ascites.

Musculoskeletal: No acute osseous abnormality.
IMPRESSION: 1. 3.1 cm segment 6 lesion has imaging features consistent with an
area of focal nodular hyperplasia.
2.  No acute abdominal process.

## 2022-06-07 ENCOUNTER — Emergency Department (HOSPITAL_BASED_OUTPATIENT_CLINIC_OR_DEPARTMENT_OTHER): Payer: Self-pay | Admitting: Radiology

## 2022-06-07 ENCOUNTER — Emergency Department (HOSPITAL_BASED_OUTPATIENT_CLINIC_OR_DEPARTMENT_OTHER)
Admission: EM | Admit: 2022-06-07 | Discharge: 2022-06-07 | Disposition: A | Discharge status: Home / Self Care | Payer: Self-pay | Attending: Emergency Medicine | Admitting: Emergency Medicine

## 2022-06-07 ENCOUNTER — Encounter (HOSPITAL_BASED_OUTPATIENT_CLINIC_OR_DEPARTMENT_OTHER): Payer: Self-pay

## 2022-06-07 ENCOUNTER — Other Ambulatory Visit: Payer: Self-pay

## 2022-06-07 DIAGNOSIS — R0789 Other chest pain: Secondary | ICD-10-CM

## 2022-06-07 DIAGNOSIS — R079 Chest pain, unspecified: Secondary | ICD-10-CM

## 2022-06-07 DIAGNOSIS — M940 Chondrocostal junction syndrome [Tietze]: Secondary | ICD-10-CM | POA: Insufficient documentation

## 2022-06-07 MED ORDER — METHYLPREDNISOLONE 4 MG PO TBPK: ORAL_TABLET | ORAL | 0 refills | Status: AC
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
## 2022-06-07 NOTE — ED Notes (Signed)
Patient verbalizes understanding of discharge instructions. Opportunity for questioning and answers were provided. Patient discharged from ED.  °

## 2022-06-07 NOTE — Discharge Instructions (Addendum)
You were seen in the emergency department for chest pain.  As we discussed, I think your symptoms are related to a muscle strain and inflammation of the cartilage around your ribs. Medications like ibuprofen or tylenol can be helpful, but I'd like to also try a course of some steroids for you.  Continue to monitor how you're doing and return to the ER for new or worsening symptoms.

## 2023-05-18 ENCOUNTER — Encounter: Payer: Self-pay | Admitting: Behavioral Health

## 2023-05-18 ENCOUNTER — Ambulatory Visit: Payer: BC Managed Care – PPO | Admitting: Behavioral Health

## 2023-05-18 DIAGNOSIS — F4010 Social phobia, unspecified: Secondary | ICD-10-CM

## 2023-05-18 DIAGNOSIS — F331 Major depressive disorder, recurrent, moderate: Secondary | ICD-10-CM

## 2023-05-18 DIAGNOSIS — F411 Generalized anxiety disorder: Secondary | ICD-10-CM
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
## 2023-05-26 ENCOUNTER — Encounter: Payer: Self-pay | Admitting: Behavioral Health

## 2023-05-26 ENCOUNTER — Ambulatory Visit (INDEPENDENT_AMBULATORY_CARE_PROVIDER_SITE_OTHER): Payer: BC Managed Care – PPO | Admitting: Behavioral Health

## 2023-05-26 DIAGNOSIS — F4310 Post-traumatic stress disorder, unspecified: Secondary | ICD-10-CM | POA: Diagnosis not present
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
## 2023-05-26 NOTE — Progress Notes (Signed)
                Craig M Peters, LCMHC 

## 2023-06-02 DIAGNOSIS — F411 Generalized anxiety disorder: Secondary | ICD-10-CM | POA: Diagnosis not present

## 2023-06-02 DIAGNOSIS — F5105 Insomnia due to other mental disorder: Secondary | ICD-10-CM | POA: Diagnosis not present

## 2023-06-02 DIAGNOSIS — F900 Attention-deficit hyperactivity disorder, predominantly inattentive type: Secondary | ICD-10-CM | POA: Diagnosis not present

## 2023-06-02 DIAGNOSIS — F330 Major depressive disorder, recurrent, mild: Secondary | ICD-10-CM | POA: Diagnosis not present

## 2023-06-03 ENCOUNTER — Encounter: Payer: Self-pay | Admitting: Behavioral Health

## 2023-06-03 ENCOUNTER — Ambulatory Visit (INDEPENDENT_AMBULATORY_CARE_PROVIDER_SITE_OTHER): Payer: BC Managed Care – PPO | Admitting: Behavioral Health

## 2023-06-03 DIAGNOSIS — F4310 Post-traumatic stress disorder, unspecified: Secondary | ICD-10-CM | POA: Diagnosis not present
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
## 2023-06-12 ENCOUNTER — Encounter: Payer: Self-pay | Admitting: Behavioral Health

## 2023-06-12 ENCOUNTER — Ambulatory Visit: Payer: BC Managed Care – PPO | Admitting: Behavioral Health

## 2023-06-12 DIAGNOSIS — F4310 Post-traumatic stress disorder, unspecified: Secondary | ICD-10-CM
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
## 2023-06-18 ENCOUNTER — Ambulatory Visit (INDEPENDENT_AMBULATORY_CARE_PROVIDER_SITE_OTHER): Payer: BC Managed Care – PPO | Admitting: Behavioral Health

## 2023-06-18 ENCOUNTER — Encounter: Payer: Self-pay | Admitting: Behavioral Health

## 2023-06-18 DIAGNOSIS — F4310 Post-traumatic stress disorder, unspecified: Secondary | ICD-10-CM | POA: Diagnosis not present

## 2023-06-18 NOTE — Progress Notes (Signed)
[place] [facility] Counselor/Therapist Progress Note  Patient ID: Tara Jones, MRN: [id number],    Date: 06/18/2023  Time Spent: 60 minutes, 9:00 a.m. to 10:00 a.m..This session was held via video teletherapy. The patient consented to the video teletherapy and was located in her office during this session. She is aware it is the responsibility of the patient to secure confidentiality on her end of the session. The provider was in a private home office for the duration of this session.    The patient arrived on time for her Caregility session   Treatment Type: Individual Therapy  Reported Symptoms: Anxiety/stress, depression  Mental Status Exam: Appearance:  Casual and Well Groomed     Behavior: Appropriate  Motor: Normal  Speech/Language:  Normal Rate  Affect: Appropriate  Mood: normal  Thought process: normal  Thought content:   WNL  Sensory/Perceptual disturbances:   WNL  Orientation: oriented to person, place, time/date, situation, day of week, month of year, and year  Attention: Good  Concentration: Good  Memory: WNL  Fund of knowledge:  Good  Insight:   Good  Judgment:  Good  Impulse Control: Good   Risk Assessment: Danger to Self:  No Self-injurious Behavior: No Danger to Others: No Duty to Warn:no Physical Aggression / Violence:No  Access to Firearms a concern: No  Gang Involvement:No   Subjective: The patient went to her parent's house over the weekend and it was emotional because of the loss of her dog.  She said that she cried and just about every room of the house but realized that was what she needed to do and it was very cathartic.  She recognizes that will still be times when she agrees but she feels like she is doing that in a positive way.  There were a couple of things which were triggering for her over the past week.  1 was a text conversation with her best friend.  The patient said that she had decided to do something that she had been  talking about for years and that her friend was instantly critical citing a different motivation than the patient had in mind and that led to some conflict.  We processed that but it feels like they have started to work through that and the patient stands by her choice.  Conversations with her maternal grandmother and mother also lead to arguments.  She describes her maternal grandmother has very racist, antisemitic and homophobic and that really bothers the patient.  In the past she and her mother have always commiserated about how difficult the grandmother is to deal with but recently the mother has become very defensive and not understanding send the patient is talking about her mother.  She feels that her mother not listening to her means that what her grandmother says is okay but she knows intellectually that is not the case.  The patient has started a new approach with her grandmother telling her that she does not like what her grandmother is saying but not allowing it to become an argument and setting a clear boundary with her grandmother that she will stop the conversation of any of those subjects for others that are offensive come to the surface.  We also talked about setting healthy boundaries with her mother especially in conjunction with frustrations with her grandmother if the mom is not in a place where she can be supportive of that.  She says that her mother much like her grandmother likes to play the victim  and feels that is what her mother is doing in this case so again I encouraged setting clear boundaries.  She does contract for safety having no thoughts of hurting herself or anyone else. Interventions: Cognitive Behavioral Therapy  Diagnosis: Posttraumatic stress disorder  Plan: I will meet with the patient every week primarily  via care agility Progress: 25% Treatment plan: We will use cognitive behavioral therapy as well as elements of ego supportive therapy and dialectical behavior  therapy with a goal of reducing the patient's anxiety and depression by at least 50% by November 27, 2023.  Goals for depression include the patient having less depression as evidenced by her report in session as well as PHQ-9 scores, to have improved mood and for her to return to a healthier level of functioning.  We will look to identify causes for depressed mood and learn coping skills for reduction of depression.  Interventions including using cognitive behavioral therapy to help her explore and replace unhealthy thoughts and behaviors contributing to depression, processed how she experiences depression daily, provide education about depression to help her identify its causes and symptoms as well as sharing feelings of depression.  We will teach and encouraged the use of coping skills for management of depression.  Goals for anxiety are to improve her ability to manage anxiety symptoms, panic symptoms and better handle stress.  We will identify causes for anxiety and panic and explore ways to reduce it as well as resolving and processing core conflicts that are contributing to anxiety and panic.  We will also work on managing worrisome thoughts contributing to feelings of anxiety and stress.  Interventions include providing education about anxiety to help her understand and identify its causes and symptoms, facilitate problem solution skills for reducing stress and anxiety as well as teach coping skills to manage anxiety such as grounding exercises, progressive muscle relaxation etc.  We will also use cognitive behavioral therapy to identify and change anxiety producing thoughts and behaviors as well as teach dialectical behavior therapy distress tolerance and mindfulness skills to help her learn anxiety management skills.  Goals for reducing trauma responses for PTSD include helping the patient recall the trauma without feeling overwhelmed or consumed with negative emotion as well as helping her to return to  a pre trauma level of functioning.  Interventions include helping the patient identify the traumatic events impact her daily life as well as process feelings associated with the trauma including guilt and shame and sadness.  We will explore and as much detail as she is comfortable with sharing feelings regarding the trauma allowing for the gradual reduction of the emotional response through storytelling.  We will explore and reframe the patient's negative self talk associated with the trauma and identify negative self-defeating thoughts replacing it with positive thoughts and talk.  Lastly we will teach relaxation techniques to help her manage anxiety associated with the trauma.  French Ana, [facility]                  French Ana, [facility]               French Ana, [facility]               French Ana, [facility]

## 2023-06-26 ENCOUNTER — Encounter: Payer: Self-pay | Admitting: Behavioral Health

## 2023-06-26 ENCOUNTER — Ambulatory Visit (INDEPENDENT_AMBULATORY_CARE_PROVIDER_SITE_OTHER): Payer: BC Managed Care – PPO | Admitting: Behavioral Health

## 2023-06-26 DIAGNOSIS — F4310 Post-traumatic stress disorder, unspecified: Secondary | ICD-10-CM | POA: Diagnosis not present

## 2023-06-26 DIAGNOSIS — F411 Generalized anxiety disorder: Secondary | ICD-10-CM
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
## 2023-07-02 ENCOUNTER — Encounter: Payer: Self-pay | Admitting: Behavioral Health

## 2023-07-02 ENCOUNTER — Ambulatory Visit (INDEPENDENT_AMBULATORY_CARE_PROVIDER_SITE_OTHER): Payer: BC Managed Care – PPO | Admitting: Behavioral Health

## 2023-07-02 DIAGNOSIS — F4310 Post-traumatic stress disorder, unspecified: Secondary | ICD-10-CM | POA: Diagnosis not present

## 2023-07-02 NOTE — Progress Notes (Signed)
[facility] Counselor/Therapist Progress Note  Patient ID: Tara Jones, MRN: [id number],    Date: 07/02/2023  Time Spent: 58 minutes, 1:00pm to 1:58pm..This session was held via video teletherapy. The patient consented to the video teletherapy and was located in her office during this session. She is aware it is the responsibility of the patient to secure confidentiality on her end of the session. The provider was in a private home office for the duration of this session.    The patient arrived on time for her Caregility session   Treatment Type: Individual Therapy  Reported Symptoms: Anxiety/stress, depression  Mental Status Exam: Appearance:  Casual and Well Groomed     Behavior: Appropriate  Motor: Normal  Speech/Language:  Normal Rate  Affect: Appropriate  Mood: normal  Thought process: normal  Thought content:   WNL  Sensory/Perceptual disturbances:   WNL  Orientation: oriented to person, place, time/date, situation, day of week, month of year, and year  Attention: Good  Concentration: Good  Memory: WNL  Fund of knowledge:  Good  Insight:   Good  Judgment:  Good  Impulse Control: Good   Risk Assessment: Danger to Self:  No Self-injurious Behavior: No Danger to Others: No Duty to Warn:no Physical Aggression / Violence:No  Access to Firearms a concern: No  Gang Involvement:No   Subjective: The patient will be flying to [place] on Monday of next week to meet with the owners and other people in the company that she is fairly new with.  She has met people from the local office as well as her Psychologist, educational.  There is some anxiety about meeting those people.  Her manager will be presenting some things that they have been working on which may or may not be received well although she and her manager believe it is best for the company and that worked really hard in preparation on this project as well as for the presentation.  She also is anxious about being  on the plane based on her past traumatic experience for her.  She recently was scheduled to fly home on 911 but is going to fly on the day afterward.  We looked at things that she can do in terms of anxiety reduction including sitting in the aisle seat, taking Xanax which was prescribed for her.  I encouraged her to try it at least once before going if not twice and she is going to pick that up today or tomorrow.  We also talked about the cognitive aspect of meeting with new coworkers including the fact that she has been very intentional picking out what she is going to wear her make-up or hair.  We talked about the things that she says to herself including that she is qualified, that she is intelligent, that she is prepared well etc.  Encouraged her to breathe as well as use and grounding exercises prior to meeting with her new coworkers.  She also knows that she can call if she needs to be able to speak even though we do not have a scheduled appointment and I will call her back when I have some free time.  She does contract for safety having no thoughts of hurting herself or anyone else. Interventions: Cognitive Behavioral Therapy  Diagnosis: Posttraumatic stress disorder  Plan: I will meet with the patient every week primarily  via care agility Progress: 30% Treatment plan: We will use cognitive behavioral therapy as well as elements of ego supportive therapy and  dialectical behavior therapy with a goal of reducing the patient's anxiety and depression by at least 50% by November 27, 2023.  Goals for depression include the patient having less depression as evidenced by her report in session as well as PHQ-9 scores, to have improved mood and for her to return to a healthier level of functioning.  We will look to identify causes for depressed mood and learn coping skills for reduction of depression.  Interventions including using cognitive behavioral therapy to help her explore and replace unhealthy thoughts  and behaviors contributing to depression, processed how she experiences depression daily, provide education about depression to help her identify its causes and symptoms as well as sharing feelings of depression.  We will teach and encouraged the use of coping skills for management of depression.  Goals for anxiety are to improve her ability to manage anxiety symptoms, panic symptoms and better handle stress.  We will identify causes for anxiety and panic and explore ways to reduce it as well as resolving and processing core conflicts that are contributing to anxiety and panic.  We will also work on managing worrisome thoughts contributing to feelings of anxiety and stress.  Interventions include providing education about anxiety to help her understand and identify its causes and symptoms, facilitate problem solution skills for reducing stress and anxiety as well as teach coping skills to manage anxiety such as grounding exercises, progressive muscle relaxation etc.  We will also use cognitive behavioral therapy to identify and change anxiety producing thoughts and behaviors as well as teach dialectical behavior therapy distress tolerance and mindfulness skills to help her learn anxiety management skills.  Goals for reducing trauma responses for PTSD include helping the patient recall the trauma without feeling overwhelmed or consumed with negative emotion as well as helping her to return to a pre trauma level of functioning.  Interventions include helping the patient identify the traumatic events impact her daily life as well as process feelings associated with the trauma including guilt and shame and sadness.  We will explore and as much detail as she is comfortable with sharing feelings regarding the trauma allowing for the gradual reduction of the emotional response through storytelling.  We will explore and reframe the patient's negative self talk associated with the trauma and identify negative  self-defeating thoughts replacing it with positive thoughts and talk.  Lastly we will teach relaxation techniques to help her manage anxiety associated with the trauma.  Tara Jones, [facility]                  Tara Jones, [facility]               Tara Jones, [facility]               Tara Jones, [facility]               Tara Jones, [facility]               Tara Jones, [facility]

## 2023-07-10 ENCOUNTER — Ambulatory Visit (INDEPENDENT_AMBULATORY_CARE_PROVIDER_SITE_OTHER): Payer: BC Managed Care – PPO | Admitting: Behavioral Health

## 2023-07-10 ENCOUNTER — Encounter: Payer: Self-pay | Admitting: Behavioral Health

## 2023-07-10 DIAGNOSIS — F4310 Post-traumatic stress disorder, unspecified: Secondary | ICD-10-CM | POA: Diagnosis not present

## 2023-07-10 NOTE — Progress Notes (Unsigned)
[facility] Counselor/Therapist Progress Note  Patient ID: SANYA MILLWEE, MRN: [id number],    Date: 07/10/2023  Time Spent: 58 minutes, 1:00pm to 1:58pm..This session was held via video teletherapy. The patient consented to the video teletherapy and was located in her office during this session. She is aware it is the responsibility of the patient to secure confidentiality on her end of the session. The provider was in a private home office for the duration of this session.    The patient arrived on time for her Caregility session   Treatment Type: Individual Therapy  Reported Symptoms: Anxiety/stress, depression  Mental Status Exam: Appearance:  Casual and Well Groomed     Behavior: Appropriate  Motor: Normal  Speech/Language:  Normal Rate  Affect: Appropriate  Mood: normal  Thought process: normal  Thought content:   WNL  Sensory/Perceptual disturbances:   WNL  Orientation: oriented to person, place, time/date, situation, day of week, month of year, and year  Attention: Good  Concentration: Good  Memory: WNL  Fund of knowledge:  Good  Insight:   Good  Judgment:  Good  Impulse Control: Good   Risk Assessment: Danger to Self:  No Self-injurious Behavior: No Danger to Others: No Duty to Warn:no Physical Aggression / Violence:No  Access to Firearms a concern: No  Gang Involvement:No   Subjective: The patient will be flying to [place] on Monday of next week to meet with the owners and other people in the company that she is fairly new with.  She has met people from the local office as well as her Psychologist, educational.  There is some anxiety about meeting those people.  Her manager will be presenting some things that they have been working on which may or may not be received well although she and her manager believe it is best for the company and that worked really hard in preparation on this project as well as for the presentation.  She also is anxious about being  on the plane based on her past traumatic experience for her.  She recently was scheduled to fly home on 911 but is going to fly on the day afterward.  We looked at things that she can do in terms of anxiety reduction including sitting in the aisle seat, taking Xanax which was prescribed for her.  I encouraged her to try it at least once before going if not twice and she is going to pick that up today or tomorrow.  We also talked about the cognitive aspect of meeting with new coworkers including the fact that she has been very intentional picking out what she is going to wear her make-up or hair.  We talked about the things that she says to herself including that she is qualified, that she is intelligent, that she is prepared well etc.  Encouraged her to breathe as well as use and grounding exercises prior to meeting with her new coworkers.  She also knows that she can call if she needs to be able to speak even though we do not have a scheduled appointment and I will call her back when I have some free time.  She does contract for safety having no thoughts of hurting herself or anyone else. Interventions: Cognitive Behavioral Therapy  Diagnosis: Posttraumatic stress disorder  Plan: I will meet with the patient every week primarily  via care agility Progress: 30% Treatment plan: We will use cognitive behavioral therapy as well as elements of ego supportive therapy and  dialectical behavior therapy with a goal of reducing the patient's anxiety and depression by at least 50% by November 27, 2023.  Goals for depression include the patient having less depression as evidenced by her report in session as well as PHQ-9 scores, to have improved mood and for her to return to a healthier level of functioning.  We will look to identify causes for depressed mood and learn coping skills for reduction of depression.  Interventions including using cognitive behavioral therapy to help her explore and replace unhealthy thoughts  and behaviors contributing to depression, processed how she experiences depression daily, provide education about depression to help her identify its causes and symptoms as well as sharing feelings of depression.  We will teach and encouraged the use of coping skills for management of depression.  Goals for anxiety are to improve her ability to manage anxiety symptoms, panic symptoms and better handle stress.  We will identify causes for anxiety and panic and explore ways to reduce it as well as resolving and processing core conflicts that are contributing to anxiety and panic.  We will also work on managing worrisome thoughts contributing to feelings of anxiety and stress.  Interventions include providing education about anxiety to help her understand and identify its causes and symptoms, facilitate problem solution skills for reducing stress and anxiety as well as teach coping skills to manage anxiety such as grounding exercises, progressive muscle relaxation etc.  We will also use cognitive behavioral therapy to identify and change anxiety producing thoughts and behaviors as well as teach dialectical behavior therapy distress tolerance and mindfulness skills to help her learn anxiety management skills.  Goals for reducing trauma responses for PTSD include helping the patient recall the trauma without feeling overwhelmed or consumed with negative emotion as well as helping her to return to a pre trauma level of functioning.  Interventions include helping the patient identify the traumatic events impact her daily life as well as process feelings associated with the trauma including guilt and shame and sadness.  We will explore and as much detail as she is comfortable with sharing feelings regarding the trauma allowing for the gradual reduction of the emotional response through storytelling.  We will explore and reframe the patient's negative self talk associated with the trauma and identify negative  self-defeating thoughts replacing it with positive thoughts and talk.  Lastly we will teach relaxation techniques to help her manage anxiety associated with the trauma.  French Ana, [facility]                  French Ana, [facility]               French Ana, [facility]               French Ana, [facility]               French Ana, [facility]               French Ana, [facility]               French Ana, [facility]

## 2023-07-17 ENCOUNTER — Ambulatory Visit (INDEPENDENT_AMBULATORY_CARE_PROVIDER_SITE_OTHER): Payer: BC Managed Care – PPO | Admitting: Behavioral Health

## 2023-07-17 ENCOUNTER — Encounter: Payer: Self-pay | Admitting: Behavioral Health

## 2023-07-17 DIAGNOSIS — F4310 Post-traumatic stress disorder, unspecified: Secondary | ICD-10-CM | POA: Diagnosis not present

## 2023-07-17 DIAGNOSIS — F902 Attention-deficit hyperactivity disorder, combined type: Secondary | ICD-10-CM
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
## 2023-07-23 ENCOUNTER — Encounter: Payer: Self-pay | Admitting: Behavioral Health

## 2023-07-23 ENCOUNTER — Ambulatory Visit: Payer: BC Managed Care – PPO | Admitting: Behavioral Health

## 2023-07-23 DIAGNOSIS — F331 Major depressive disorder, recurrent, moderate: Secondary | ICD-10-CM

## 2023-07-23 DIAGNOSIS — F4310 Post-traumatic stress disorder, unspecified: Secondary | ICD-10-CM

## 2023-07-23 DIAGNOSIS — F411 Generalized anxiety disorder: Secondary | ICD-10-CM

## 2023-07-23 NOTE — Progress Notes (Signed)
[facility] Counselor/Therapist Progress Note  Patient ID: Tara Jones, MRN: [id number],    Date: 07/23/2023  Time Spent: 58 minutes, 1:00pm to 1:58pm..This session was held via video teletherapy. The patient consented to the video teletherapy and was located in her office during this session. She is aware it is the responsibility of the patient to secure confidentiality on her end of the session. The provider was in a private home office for the duration of this session.    The patient arrived on time for her Caregility session   Treatment Type: Individual Therapy  Reported Symptoms: Anxiety/stress, depression  Mental Status Exam: Appearance:  Casual and Well Groomed     Behavior: Appropriate  Motor: Normal  Speech/Language:  Normal Rate  Affect: Appropriate  Mood: normal  Thought process: normal  Thought content:   WNL  Sensory/Perceptual disturbances:   WNL  Orientation: oriented to person, place, time/date, situation, day of week, month of year, and year  Attention: Good  Concentration: Good  Memory: WNL  Fund of knowledge:  Good  Insight:   Good  Judgment:  Good  Impulse Control: Good   Risk Assessment: Danger to Self:  No Self-injurious Behavior: No Danger to Others: No Duty to Warn:no Physical Aggression / Violence:No  Access to Firearms a concern: No  Gang Involvement:No   Subjective: The patient took a Xanax before going to [place] and said that she handled it fairly well so she did use them on the trip.  She did very well on the flight as well as with a difficult landing.  She had some mild anxiety when getting to the [place] especially meeting new people but felt that she did very well and connected very well with her coworkers.  She felt that she handled herself very well professionally and received a phone call yesterday from the head of the company thanking her for the great job that she was doing.  She feels that she is beginning to feel  like a part of the team and knows intellectually and vocationally that she has a strong skill set.  We also looked at relationships and how past relationships have affected more recent relationships and what she is looking for in relationships both in friendships and in dating.  She does contract for safety having no thoughts of hurting herself or anyone else. Interventions: Cognitive Behavioral Therapy  Diagnosis: Posttraumatic stress disorder  Plan: I will meet with the patient every week primarily  via care agility Progress: 30% Treatment plan: We will use cognitive behavioral therapy as well as elements of ego supportive therapy and dialectical behavior therapy with a goal of reducing the patient's anxiety and depression by at least 50% by November 27, 2023.  Goals for depression include the patient having less depression as evidenced by her report in session as well as PHQ-9 scores, to have improved mood and for her to return to a healthier level of functioning.  We will look to identify causes for depressed mood and learn coping skills for reduction of depression.  Interventions including using cognitive behavioral therapy to help her explore and replace unhealthy thoughts and behaviors contributing to depression, processed how she experiences depression daily, provide education about depression to help her identify its causes and symptoms as well as sharing feelings of depression.  We will teach and encouraged the use of coping skills for management of depression.  Goals for anxiety are to improve her ability to manage anxiety symptoms, panic symptoms and  better handle stress.  We will identify causes for anxiety and panic and explore ways to reduce it as well as resolving and processing core conflicts that are contributing to anxiety and panic.  We will also work on managing worrisome thoughts contributing to feelings of anxiety and stress.  Interventions include providing education about anxiety  to help her understand and identify its causes and symptoms, facilitate problem solution skills for reducing stress and anxiety as well as teach coping skills to manage anxiety such as grounding exercises, progressive muscle relaxation etc.  We will also use cognitive behavioral therapy to identify and change anxiety producing thoughts and behaviors as well as teach dialectical behavior therapy distress tolerance and mindfulness skills to help her learn anxiety management skills.  Goals for reducing trauma responses for PTSD include helping the patient recall the trauma without feeling overwhelmed or consumed with negative emotion as well as helping her to return to a pre trauma level of functioning.  Interventions include helping the patient identify the traumatic events impact her daily life as well as process feelings associated with the trauma including guilt and shame and sadness.  We will explore and as much detail as she is comfortable with sharing feelings regarding the trauma allowing for the gradual reduction of the emotional response through storytelling.  We will explore and reframe the patient's negative self talk associated with the trauma and identify negative self-defeating thoughts replacing it with positive thoughts and talk.  Lastly we will teach relaxation techniques to help her manage anxiety associated with the trauma.  French Ana, [facility]                  French Ana, [facility]               French Ana, [facility]               French Ana, [facility]               French Ana, [facility]               French Ana, [facility]               French Ana, [facility]  [facility] Counselor/Therapist Progress Note  Patient ID: Tara Jones, MRN: [id number],    Date: 07/23/2023  Time Spent: 58 minutes, 4:00pm to 4:58pm..This session was held via video teletherapy. The patient consented to  the video teletherapy and was located in her office during this session. She is aware it is the responsibility of the patient to secure confidentiality on her end of the session. The provider was in a private home office for the duration of this session.    The patient arrived on time for her Caregility session   Treatment Type: Individual Therapy  Reported Symptoms: Anxiety/stress, depression  Mental Status Exam: Appearance:  Casual and Well Groomed     Behavior: Appropriate  Motor: Normal  Speech/Language:  Normal Rate  Affect: Appropriate  Mood: normal  Thought process: normal  Thought content:   WNL  Sensory/Perceptual disturbances:   WNL  Orientation: oriented to person, place, time/date, situation, day of week, month of year, and year  Attention: Good  Concentration: Good  Memory: WNL  Fund of knowledge:  Good  Insight:   Good  Judgment:  Good  Impulse Control: Good   Risk Assessment: Danger to Self:  No Self-injurious Behavior: No Danger to Others: No Duty to Warn:no Physical  Aggression / Violence:No  Access to Firearms a concern: No  Gang Involvement:No   Subjective: Took time at the beginning of the session to process some of last week's session and her reaction to that.  It also started some additional thoughts and feelings which we processed during this session.  The patient is making progress in that she is seeing intellectually her skill set and we will continue to work on building self image and challenging thoughts and feelings projected on the patient by her biological mother and how that has impacted her.  She does contract for safety having no thoughts of hurting herself or anyone else. Interventions: Cognitive Behavioral Therapy  Diagnosis: Posttraumatic stress disorder  Plan: I will meet with the patient every week primarily  via care agility Progress: 30% Treatment plan: We will use cognitive behavioral therapy as well as elements of ego supportive  therapy and dialectical behavior therapy with a goal of reducing the patient's anxiety and depression by at least 50% by November 27, 2023.  Goals for depression include the patient having less depression as evidenced by her report in session as well as PHQ-9 scores, to have improved mood and for her to return to a healthier level of functioning.  We will look to identify causes for depressed mood and learn coping skills for reduction of depression.  Interventions including using cognitive behavioral therapy to help her explore and replace unhealthy thoughts and behaviors contributing to depression, processed how she experiences depression daily, provide education about depression to help her identify its causes and symptoms as well as sharing feelings of depression.  We will teach and encouraged the use of coping skills for management of depression.  Goals for anxiety are to improve her ability to manage anxiety symptoms, panic symptoms and better handle stress.  We will identify causes for anxiety and panic and explore ways to reduce it as well as resolving and processing core conflicts that are contributing to anxiety and panic.  We will also work on managing worrisome thoughts contributing to feelings of anxiety and stress.  Interventions include providing education about anxiety to help her understand and identify its causes and symptoms, facilitate problem solution skills for reducing stress and anxiety as well as teach coping skills to manage anxiety such as grounding exercises, progressive muscle relaxation etc.  We will also use cognitive behavioral therapy to identify and change anxiety producing thoughts and behaviors as well as teach dialectical behavior therapy distress tolerance and mindfulness skills to help her learn anxiety management skills.  Goals for reducing trauma responses for PTSD include helping the patient recall the trauma without feeling overwhelmed or consumed with negative emotion as  well as helping her to return to a pre trauma level of functioning.  Interventions include helping the patient identify the traumatic events impact her daily life as well as process feelings associated with the trauma including guilt and shame and sadness.  We will explore and as much detail as she is comfortable with sharing feelings regarding the trauma allowing for the gradual reduction of the emotional response through storytelling.  We will explore and reframe the patient's negative self talk associated with the trauma and identify negative self-defeating thoughts replacing it with positive thoughts and talk.  Lastly we will teach relaxation techniques to help her manage anxiety associated with the trauma.  [place] Ana, [facility]                  French Ana, [facility]  [place] Ana, [facility]               French Ana, [facility]               French Ana, [facility]               French Ana, [facility]               French Ana, [facility]               French Ana, [facility]               French Ana, [facility]

## 2023-07-28 ENCOUNTER — Ambulatory Visit (INDEPENDENT_AMBULATORY_CARE_PROVIDER_SITE_OTHER): Payer: BC Managed Care – PPO | Admitting: Behavioral Health

## 2023-07-28 ENCOUNTER — Encounter: Payer: Self-pay | Admitting: Behavioral Health

## 2023-07-28 DIAGNOSIS — F4310 Post-traumatic stress disorder, unspecified: Secondary | ICD-10-CM | POA: Diagnosis not present
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
## 2023-08-07 ENCOUNTER — Ambulatory Visit: Payer: BC Managed Care – PPO | Admitting: Behavioral Health

## 2023-08-07 ENCOUNTER — Encounter: Payer: Self-pay | Admitting: Behavioral Health

## 2023-08-07 DIAGNOSIS — F4310 Post-traumatic stress disorder, unspecified: Secondary | ICD-10-CM | POA: Diagnosis not present
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
## 2023-08-14 ENCOUNTER — Encounter: Payer: Self-pay | Admitting: Behavioral Health

## 2023-08-14 ENCOUNTER — Ambulatory Visit (INDEPENDENT_AMBULATORY_CARE_PROVIDER_SITE_OTHER): Payer: BC Managed Care – PPO | Admitting: Behavioral Health

## 2023-08-14 DIAGNOSIS — F4310 Post-traumatic stress disorder, unspecified: Secondary | ICD-10-CM | POA: Diagnosis not present
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
## 2023-08-21 ENCOUNTER — Encounter: Payer: Self-pay | Admitting: Behavioral Health

## 2023-08-21 ENCOUNTER — Ambulatory Visit (INDEPENDENT_AMBULATORY_CARE_PROVIDER_SITE_OTHER): Payer: BC Managed Care – PPO | Admitting: Behavioral Health

## 2023-08-21 DIAGNOSIS — F4310 Post-traumatic stress disorder, unspecified: Secondary | ICD-10-CM | POA: Diagnosis not present
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
## 2023-08-27 ENCOUNTER — Ambulatory Visit: Payer: BC Managed Care – PPO | Admitting: Behavioral Health

## 2023-08-27 DIAGNOSIS — F4310 Post-traumatic stress disorder, unspecified: Secondary | ICD-10-CM | POA: Diagnosis not present

## 2023-08-27 DIAGNOSIS — F411 Generalized anxiety disorder: Secondary | ICD-10-CM

## 2023-08-28 ENCOUNTER — Encounter: Payer: Self-pay | Admitting: Behavioral Health

## 2023-08-28 DIAGNOSIS — F17210 Nicotine dependence, cigarettes, uncomplicated: Secondary | ICD-10-CM | POA: Diagnosis not present

## 2023-08-28 DIAGNOSIS — F900 Attention-deficit hyperactivity disorder, predominantly inattentive type: Secondary | ICD-10-CM | POA: Diagnosis not present

## 2023-08-28 DIAGNOSIS — F5105 Insomnia due to other mental disorder: Secondary | ICD-10-CM | POA: Diagnosis not present

## 2023-08-28 DIAGNOSIS — F330 Major depressive disorder, recurrent, mild: Secondary | ICD-10-CM | POA: Diagnosis not present

## 2023-08-28 DIAGNOSIS — F411 Generalized anxiety disorder: Secondary | ICD-10-CM | POA: Diagnosis not present

## 2023-08-28 NOTE — Progress Notes (Signed)
[facility] Counselor/Therapist Progress Note  Patient ID: WHITNI PASQUINI, MRN: [id number],    Date: 08/28/2023  Time Spent: 58 minutes, 4 PM until 4:58 PM this session was held via video teletherapy. The patient consented to the video teletherapy and was located in her office during this session. She is aware it is the responsibility of the patient to secure confidentiality on her end of the session. The provider was in a private home office for the duration of this session.    The patient arrived on time for her Caregility session   Treatment Type: Individual Therapy  Reported Symptoms: Anxiety/stress, depression  Mental Status Exam: Appearance:  Casual and Well Groomed     Behavior: Appropriate  Motor: Normal  Speech/Language:  Normal Rate  Affect: Appropriate  Mood: normal  Thought process: normal  Thought content:   WNL  Sensory/Perceptual disturbances:   WNL  Orientation: oriented to person, place, time/date, situation, day of week, month of year, and year  Attention: Good  Concentration: Good  Memory: WNL  Fund of knowledge:  Good  Insight:   Good  Judgment:  Good  Impulse Control: Good   Risk Assessment: Danger to Self:  No Self-injurious Behavior: No Danger to Others: No Duty to Warn:no Physical Aggression / Violence:No  Access to Firearms a concern: No  Gang Involvement:No   Subjective: The patient is going to have to get a new car.  The issues with her all 1 were too expensive to replace based on this value.  That does create some financial stress but she knows she needs something that is dependable.  Work is still going well although there are times or some stress she enjoys who she is working for and with.  Her biggest stress/anxiety now is the upcoming election.  We used the session to process some of her frustrations in relation to candidates, election process, history etc.  I encouraged the use of her coping skills especially over the  next 4 or 5 days leading up to and after election day but also the importance of reminding her of the fact that not 1 candidate can necessarily change the course of a nation immediately and the reason we have checks and balances in place within government.  She does contract for safety having no thoughts of hurting herself or anyone else. Interventions: Cognitive Behavioral Therapy  Diagnosis: Posttraumatic stress disorder  Plan: I will meet with the patient every week primarily  via care agility Progress: 30% Treatment plan: We will use cognitive behavioral therapy as well as elements of ego supportive therapy and dialectical behavior therapy with a goal of reducing the patient's anxiety and depression by at least 50% by November 27, 2023.  Goals for depression include the patient having less depression as evidenced by her report in session as well as PHQ-9 scores, to have improved mood and for her to return to a healthier level of functioning.  We will look to identify causes for depressed mood and learn coping skills for reduction of depression.  Interventions including using cognitive behavioral therapy to help her explore and replace unhealthy thoughts and behaviors contributing to depression, processed how she experiences depression daily, provide education about depression to help her identify its causes and symptoms as well as sharing feelings of depression.  We will teach and encouraged the use of coping skills for management of depression.  Goals for anxiety are to improve her ability to manage anxiety symptoms, panic symptoms and better handle  stress.  We will identify causes for anxiety and panic and explore ways to reduce it as well as resolving and processing core conflicts that are contributing to anxiety and panic.  We will also work on managing worrisome thoughts contributing to feelings of anxiety and stress.  Interventions include providing education about anxiety to help her understand  and identify its causes and symptoms, facilitate problem solution skills for reducing stress and anxiety as well as teach coping skills to manage anxiety such as grounding exercises, progressive muscle relaxation etc.  We will also use cognitive behavioral therapy to identify and change anxiety producing thoughts and behaviors as well as teach dialectical behavior therapy distress tolerance and mindfulness skills to help her learn anxiety management skills.  Goals for reducing trauma responses for PTSD include helping the patient recall the trauma without feeling overwhelmed or consumed with negative emotion as well as helping her to return to a pre trauma level of functioning.  Interventions include helping the patient identify the traumatic events impact her daily life as well as process feelings associated with the trauma including guilt and shame and sadness.  We will explore and as much detail as she is comfortable with sharing feelings regarding the trauma allowing for the gradual reduction of the emotional response through storytelling.  We will explore and reframe the patient's negative self talk associated with the trauma and identify negative self-defeating thoughts replacing it with positive thoughts and talk.  Lastly we will teach relaxation techniques to help her manage anxiety associated with the trauma.  French Ana, [facility]                  French Ana, [facility]               French Ana, [facility]               French Ana, [facility]               French Ana, [facility]               French Ana, [facility]               [place] Ana, [facility]  [facility] Counselor/Therapist Progress Note  Patient ID: DAYLAN BOGGESS, MRN: [id number],    Date: 08/28/2023  Time Spent: 58 minutes, 4 PM until 4:58 PM.This session was held via video teletherapy. The patient consented to the video teletherapy  and was located in her office during this session. She is aware it is the responsibility of the patient to secure confidentiality on her end of the session. The provider was in a private home office for the duration of this session.    The patient arrived on time for her Caregility session   Treatment Type: Individual Therapy  Reported Symptoms: Anxiety/stress, depression  Mental Status Exam: Appearance:  Casual and Well Groomed     Behavior: Appropriate  Motor: Normal  Speech/Language:  Normal Rate  Affect: Appropriate  Mood: normal  Thought process: normal  Thought content:   WNL  Sensory/Perceptual disturbances:   WNL  Orientation: oriented to person, place, time/date, situation, day of week, month of year, and year  Attention: Good  Concentration: Good  Memory: WNL  Fund of knowledge:  Good  Insight:   Good  Judgment:  Good  Impulse Control: Good   Risk Assessment: Danger to Self:  No Self-injurious Behavior: No Danger to Others: No Duty to Warn:no Physical  Aggression / Violence:No  Access to Firearms a concern: No  Gang Involvement:No   Subjective: She does contract for safety having no thoughts of hurting herself or anyone else. Interventions: Cognitive Behavioral Therapy  Diagnosis: Posttraumatic stress disorder  Plan: I will meet with the patient every week primarily  via care agility Progress: 30% Treatment plan: We will use cognitive behavioral therapy as well as elements of ego supportive therapy and dialectical behavior therapy with a goal of reducing the patient's anxiety and depression by at least 50% by November 27, 2023.  Goals for depression include the patient having less depression as evidenced by her report in session as well as PHQ-9 scores, to have improved mood and for her to return to a healthier level of functioning.  We will look to identify causes for depressed mood and learn coping skills for reduction of depression.  Interventions including  using cognitive behavioral therapy to help her explore and replace unhealthy thoughts and behaviors contributing to depression, processed how she experiences depression daily, provide education about depression to help her identify its causes and symptoms as well as sharing feelings of depression.  We will teach and encouraged the use of coping skills for management of depression.  Goals for anxiety are to improve her ability to manage anxiety symptoms, panic symptoms and better handle stress.  We will identify causes for anxiety and panic and explore ways to reduce it as well as resolving and processing core conflicts that are contributing to anxiety and panic.  We will also work on managing worrisome thoughts contributing to feelings of anxiety and stress.  Interventions include providing education about anxiety to help her understand and identify its causes and symptoms, facilitate problem solution skills for reducing stress and anxiety as well as teach coping skills to manage anxiety such as grounding exercises, progressive muscle relaxation etc.  We will also use cognitive behavioral therapy to identify and change anxiety producing thoughts and behaviors as well as teach dialectical behavior therapy distress tolerance and mindfulness skills to help her learn anxiety management skills.  Goals for reducing trauma responses for PTSD include helping the patient recall the trauma without feeling overwhelmed or consumed with negative emotion as well as helping her to return to a pre trauma level of functioning.  Interventions include helping the patient identify the traumatic events impact her daily life as well as process feelings associated with the trauma including guilt and shame and sadness.  We will explore and as much detail as she is comfortable with sharing feelings regarding the trauma allowing for the gradual reduction of the emotional response through storytelling.  We will explore and reframe the  patient's negative self talk associated with the trauma and identify negative self-defeating thoughts replacing it with positive thoughts and talk.  Lastly we will teach relaxation techniques to help her manage anxiety associated with the trauma.  [place] Ana, [facility]                  French Ana, [facility]               French Ana, [facility]               French Ana, [facility]               French Ana, [facility]               French Ana, [facility]  French Ana, [facility]               French Ana, [facility]               French Ana, [facility]               French Ana, [facility]               French Ana, [facility]               French Ana, [facility]               [place] Ana, [facility]       French Ana, [facility]

## 2023-09-03 ENCOUNTER — Encounter: Payer: Self-pay | Admitting: Behavioral Health

## 2023-09-03 ENCOUNTER — Ambulatory Visit: Payer: BC Managed Care – PPO | Admitting: Behavioral Health

## 2023-09-03 DIAGNOSIS — F4310 Post-traumatic stress disorder, unspecified: Secondary | ICD-10-CM

## 2023-09-03 NOTE — Progress Notes (Signed)
[facility] Counselor/Therapist Progress Note  Patient ID: BRANDIE LOPES, MRN: [id number],    Date: 09/03/23  Time Spent: 60 minutes, 4 PM until 5:00PM this session was held via video teletherapy. The patient consented to the video teletherapy and was located in her office during this session. She is aware it is the responsibility of the patient to secure confidentiality on her end of the session. The provider was in a private home office for the duration of this session.    The patient arrived on time for her Caregility session   Treatment Type: Individual Therapy  Reported Symptoms: Anxiety/stress, depression  Mental Status Exam: Appearance:  Casual and Well Groomed     Behavior: Appropriate  Motor: Normal  Speech/Language:  Normal Rate  Affect: Appropriate  Mood: normal  Thought process: normal  Thought content:   WNL  Sensory/Perceptual disturbances:   WNL  Orientation: oriented to person, place, time/date, situation, day of week, month of year, and year  Attention: Good  Concentration: Good  Memory: WNL  Fund of knowledge:  Good  Insight:   Good  Judgment:  Good  Impulse Control: Good   Risk Assessment: Danger to Self:  No Self-injurious Behavior: No Danger to Others: No Duty to Warn:no Physical Aggression / Violence:No  Access to Firearms a concern: No  Gang Involvement:No   Subjective: We processed the patient's frustration with the recent election cycle.  We looked at ways that she could cope in a healthy way including compartmentalizing her feelings.  We talked about the importance of her passion and although she is feeling things very strongly now encouraged her to continue to not be discouraged in things that she is passionate about.  We also looked at her work situation.  She continues to feel that she is doing a good job and has been validated which she appreciates.  She also had a conversation with her friend Minus Liberty not and feels that they  have made significant progress in working through something that had become block between the 2 of them.  She does contract for safety having no thoughts of hurting herself or anyone else. Interventions: Cognitive Behavioral Therapy  Diagnosis: Posttraumatic stress disorder  Plan: I will meet with the patient every week primarily  via care agility Progress: 30% Treatment plan: We will use cognitive behavioral therapy as well as elements of ego supportive therapy and dialectical behavior therapy with a goal of reducing the patient's anxiety and depression by at least 50% by November 27, 2023.  Goals for depression include the patient having less depression as evidenced by her report in session as well as PHQ-9 scores, to have improved mood and for her to return to a healthier level of functioning.  We will look to identify causes for depressed mood and learn coping skills for reduction of depression.  Interventions including using cognitive behavioral therapy to help her explore and replace unhealthy thoughts and behaviors contributing to depression, processed how she experiences depression daily, provide education about depression to help her identify its causes and symptoms as well as sharing feelings of depression.  We will teach and encouraged the use of coping skills for management of depression.  Goals for anxiety are to improve her ability to manage anxiety symptoms, panic symptoms and better handle stress.  We will identify causes for anxiety and panic and explore ways to reduce it as well as resolving and processing core conflicts that are contributing to anxiety and panic.  We will  also work on managing worrisome thoughts contributing to feelings of anxiety and stress.  Interventions include providing education about anxiety to help her understand and identify its causes and symptoms, facilitate problem solution skills for reducing stress and anxiety as well as teach coping skills to manage  anxiety such as grounding exercises, progressive muscle relaxation etc.  We will also use cognitive behavioral therapy to identify and change anxiety producing thoughts and behaviors as well as teach dialectical behavior therapy distress tolerance and mindfulness skills to help her learn anxiety management skills.  Goals for reducing trauma responses for PTSD include helping the patient recall the trauma without feeling overwhelmed or consumed with negative emotion as well as helping her to return to a pre trauma level of functioning.  Interventions include helping the patient identify the traumatic events impact her daily life as well as process feelings associated with the trauma including guilt and shame and sadness.  We will explore and as much detail as she is comfortable with sharing feelings regarding the trauma allowing for the gradual reduction of the emotional response through storytelling.  We will explore and reframe the patient's negative self talk associated with the trauma and identify negative self-defeating thoughts replacing it with positive thoughts and talk.  Lastly we will teach relaxation techniques to help her manage anxiety associated with the trauma.  French Ana, [facility]                  French Ana, [facility]               French Ana, [facility]               French Ana, [facility]               French Ana, [facility]               French Ana, [facility]               French Ana, [facility]  [facility] Counselor/Therapist Progress Note  Patient ID: LILLIA LENGEL, MRN: [id number],    Date: 09/03/2023  Time Spent: 58 minutes, 4 PM until 4:58 PM.This session was held via video teletherapy. The patient consented to the video teletherapy and was located in her office during this session. She is aware it is the responsibility of the patient to secure confidentiality on her end of  the session. The provider was in a private home office for the duration of this session.    The patient arrived on time for her Caregility session   Treatment Type: Individual Therapy  Reported Symptoms: Anxiety/stress, depression  Mental Status Exam: Appearance:  Casual and Well Groomed     Behavior: Appropriate  Motor: Normal  Speech/Language:  Normal Rate  Affect: Appropriate  Mood: normal  Thought process: normal  Thought content:   WNL  Sensory/Perceptual disturbances:   WNL  Orientation: oriented to person, place, time/date, situation, day of week, month of year, and year  Attention: Good  Concentration: Good  Memory: WNL  Fund of knowledge:  Good  Insight:   Good  Judgment:  Good  Impulse Control: Good   Risk Assessment: Danger to Self:  No Self-injurious Behavior: No Danger to Others: No Duty to Warn:no Physical Aggression / Violence:No  Access to Firearms a concern: No  Gang Involvement:No   Subjective: She does contract for safety having no thoughts of hurting herself or anyone else. Interventions: Cognitive Behavioral Therapy  Diagnosis: Posttraumatic stress disorder  Plan: I will meet with the patient every week primarily  via care agility Progress: 30% Treatment plan: We will use cognitive behavioral therapy as well as elements of ego supportive therapy and dialectical behavior therapy with a goal of reducing the patient's anxiety and depression by at least 50% by November 27, 2023.  Goals for depression include the patient having less depression as evidenced by her report in session as well as PHQ-9 scores, to have improved mood and for her to return to a healthier level of functioning.  We will look to identify causes for depressed mood and learn coping skills for reduction of depression.  Interventions including using cognitive behavioral therapy to help her explore and replace unhealthy thoughts and behaviors contributing to depression, processed how she  experiences depression daily, provide education about depression to help her identify its causes and symptoms as well as sharing feelings of depression.  We will teach and encouraged the use of coping skills for management of depression.  Goals for anxiety are to improve her ability to manage anxiety symptoms, panic symptoms and better handle stress.  We will identify causes for anxiety and panic and explore ways to reduce it as well as resolving and processing core conflicts that are contributing to anxiety and panic.  We will also work on managing worrisome thoughts contributing to feelings of anxiety and stress.  Interventions include providing education about anxiety to help her understand and identify its causes and symptoms, facilitate problem solution skills for reducing stress and anxiety as well as teach coping skills to manage anxiety such as grounding exercises, progressive muscle relaxation etc.  We will also use cognitive behavioral therapy to identify and change anxiety producing thoughts and behaviors as well as teach dialectical behavior therapy distress tolerance and mindfulness skills to help her learn anxiety management skills.  Goals for reducing trauma responses for PTSD include helping the patient recall the trauma without feeling overwhelmed or consumed with negative emotion as well as helping her to return to a pre trauma level of functioning.  Interventions include helping the patient identify the traumatic events impact her daily life as well as process feelings associated with the trauma including guilt and shame and sadness.  We will explore and as much detail as she is comfortable with sharing feelings regarding the trauma allowing for the gradual reduction of the emotional response through storytelling.  We will explore and reframe the patient's negative self talk associated with the trauma and identify negative self-defeating thoughts replacing it with positive thoughts and talk.   Lastly we will teach relaxation techniques to help her manage anxiety associated with the trauma.  French Ana, [facility]                  French Ana, [facility]               French Ana, [facility]               French Ana, [facility]               French Ana, [facility]               French Ana, [facility]               French Ana, [facility]               French Ana, [facility]  French Ana, [facility]               French Ana, [facility]               French Ana, [facility]               French Ana, [facility]               French Ana, [facility]       French Ana, [facility]     French Ana, [facility]               French Ana, [facility]               French Ana, [facility]               French Ana, [facility]               French Ana, [facility]                      French Ana, [facility]               French Ana, [facility]               French Ana, [facility]               French Ana, [facility]               French Ana, [facility]               French Ana, [facility]               French Ana, [facility]               French Ana, [facility]               French Ana, [facility]               French Ana, [facility]               French Ana, [facility]               French Ana, [facility]       French Ana, [facility]               French Ana, [facility]

## 2023-09-10 ENCOUNTER — Encounter: Payer: Self-pay | Admitting: Behavioral Health

## 2023-09-10 ENCOUNTER — Ambulatory Visit: Payer: BC Managed Care – PPO | Admitting: Behavioral Health

## 2023-09-10 DIAGNOSIS — F4310 Post-traumatic stress disorder, unspecified: Secondary | ICD-10-CM

## 2023-09-10 NOTE — Progress Notes (Signed)
[facility] Counselor/Therapist Progress Note  Patient ID: Tara Jones, MRN: [id number],    Date: 09/10/23  Time Spent: 60 minutes, 4 PM until 5:00PM this session was held via video teletherapy. The patient consented to the video teletherapy and was located in her office during this session. She is aware it is the responsibility of the patient to secure confidentiality on her end of the session. The provider was in a private home office for the duration of this session.    The patient arrived on time for her Caregility session   Treatment Type: Individual Therapy  Reported Symptoms: Anxiety/stress, depression  Mental Status Exam: Appearance:  Casual and Well Groomed     Behavior: Appropriate  Motor: Normal  Speech/Language:  Normal Rate  Affect: Appropriate  Mood: normal  Thought process: normal  Thought content:   WNL  Sensory/Perceptual disturbances:   WNL  Orientation: oriented to person, place, time/date, situation, day of week, month of year, and year  Attention: Good  Concentration: Good  Memory: WNL  Fund of knowledge:  Good  Insight:   Good  Judgment:  Good  Impulse Control: Good   Risk Assessment: Danger to Self:  No Self-injurious Behavior: No Danger to Others: No Duty to Warn:no Physical Aggression / Violence:No  Access to Firearms a concern: No  Gang Involvement:No   Subjective: The patient did buy a car recently and recognized that she made a decision at least temporarily which she recognizes looking back on was not necessarily as logically made if she would typically have done so.  We looked at intellectual versus emotional decision making and responses.  We also processed some of her questions about her relationship and understanding intentions and how the patient interprets them. She does contract for safety having no thoughts of hurting herself or anyone else. Interventions: Cognitive Behavioral Therapy  Diagnosis: Posttraumatic  stress disorder  Plan: I will meet with the patient every week primarily  via care agility Progress: 30% Treatment plan: We will use cognitive behavioral therapy as well as elements of ego supportive therapy and dialectical behavior therapy with a goal of reducing the patient's anxiety and depression by at least 50% by November 27, 2023.  Goals for depression include the patient having less depression as evidenced by her report in session as well as PHQ-9 scores, to have improved mood and for her to return to a healthier level of functioning.  We will look to identify causes for depressed mood and learn coping skills for reduction of depression.  Interventions including using cognitive behavioral therapy to help her explore and replace unhealthy thoughts and behaviors contributing to depression, processed how she experiences depression daily, provide education about depression to help her identify its causes and symptoms as well as sharing feelings of depression.  We will teach and encouraged the use of coping skills for management of depression.  Goals for anxiety are to improve her ability to manage anxiety symptoms, panic symptoms and better handle stress.  We will identify causes for anxiety and panic and explore ways to reduce it as well as resolving and processing core conflicts that are contributing to anxiety and panic.  We will also work on managing worrisome thoughts contributing to feelings of anxiety and stress.  Interventions include providing education about anxiety to help her understand and identify its causes and symptoms, facilitate problem solution skills for reducing stress and anxiety as well as teach coping skills to manage anxiety such as grounding exercises, progressive  muscle relaxation etc.  We will also use cognitive behavioral therapy to identify and change anxiety producing thoughts and behaviors as well as teach dialectical behavior therapy distress tolerance and mindfulness skills  to help her learn anxiety management skills.  Goals for reducing trauma responses for PTSD include helping the patient recall the trauma without feeling overwhelmed or consumed with negative emotion as well as helping her to return to a pre trauma level of functioning.  Interventions include helping the patient identify the traumatic events impact her daily life as well as process feelings associated with the trauma including guilt and shame and sadness.  We will explore and as much detail as she is comfortable with sharing feelings regarding the trauma allowing for the gradual reduction of the emotional response through storytelling.  We will explore and reframe the patient's negative self talk associated with the trauma and identify negative self-defeating thoughts replacing it with positive thoughts and talk.  Lastly we will teach relaxation techniques to help her manage anxiety associated with the trauma.  French Ana, [facility]                         French Ana, [facility]               French Ana, [facility]               French Ana, [facility]               French Ana, [facility]               French Ana, [facility]               French Ana, [facility]               French Ana, [employer] [facility]       French Ana, [facility]     French Ana, [facility]               French Ana, [facility]               French Ana, [facility]               French Ana, [facility]               French Ana, [facility]                      French Ana, [facility]               French Ana, [facility]               French Ana, [facility]               French Ana, [facility]               French Ana, [facility]               French Ana,  [facility]               French Ana, [facility]               French Ana, [facility]               French Ana, [facility]               French Ana, [facility]  French Ana, [facility]               French Ana, [facility]       French Ana, [facility]               French Ana, [facility]               French Ana, [facility]

## 2023-09-17 ENCOUNTER — Encounter: Payer: Self-pay | Admitting: Behavioral Health

## 2023-09-17 ENCOUNTER — Ambulatory Visit: Payer: BC Managed Care – PPO | Admitting: Behavioral Health

## 2023-09-17 DIAGNOSIS — F411 Generalized anxiety disorder: Secondary | ICD-10-CM

## 2023-09-17 DIAGNOSIS — F4310 Post-traumatic stress disorder, unspecified: Secondary | ICD-10-CM

## 2023-09-17 NOTE — Progress Notes (Signed)
Tara Jones, MRN: [id number],    Date: 09/17/23  Time Spent: 60 minutes, 4 PM until 5:00PM this session was held via video teletherapy. The patient consented to the video teletherapy and was located in her office during this session. She is aware it is the responsibility of the patient to secure confidentiality on her end of the session. The provider was in a private home office for the duration of this session.    The patient arrived on time for her Caregility session   Treatment Type: Individual Therapy  Reported Symptoms: Anxiety/stress, depression  Mental Status Exam: Appearance:  Casual and Well Groomed     Behavior: Appropriate  Motor: Normal  Speech/Language:  Normal Rate  Affect: Appropriate  Mood: normal  Thought process: normal  Thought content:   WNL  Sensory/Perceptual disturbances:   WNL  Orientation: oriented to person, place, time/date, situation, day of week, month of year, and year  Attention: Good  Concentration: Good  Memory: WNL  Fund of knowledge:  Good  Insight:   Good  Judgment:  Good  Impulse Control: Good   Risk Assessment: Danger to Self:  No Self-injurious Behavior: No Danger to Others: No Duty to Warn:no Physical Aggression / Violence:No  Access to Firearms a concern: No  Gang Involvement:No   Subjective: The patient found out today that there have been some significant physical changes with her cat, Butter.  She knew that he had arthritis but developed recognize that there have been significant muscle deterioration in his back legs.  There is a medication that can make him feel better but it does not reverse the loss.  This is what happened to 2 of her dogs and so she knows that triggered her a little bit.  She is going to do all that she can to make it as comfortable as possible saying he could be a year to year and a half but she wants to make whatever time it is  better.  We also looked at where she is in terms of relationships in her life and how she handles those.  We talked about the importance of communication verbal nonverbal etc.  We talked about her work situation.  Her job is going very well and she feels very valued in that.  I encouraged the practice of mindfulness;. Interventions: Cognitive Behavioral Therapy  Diagnosis: Posttraumatic stress disorder  Plan: I will meet with the patient every week primarily  via care agility Progress: 30% Treatment plan: We will use cognitive behavioral therapy as well as elements of ego supportive therapy and dialectical behavior therapy with a goal of reducing the patient's anxiety and depression by at least 50% by November 27, 2023.  Goals for depression include the patient having less depression as evidenced by her report in session as well as PHQ-9 scores, to have improved mood and for her to return to a healthier level of functioning.  We will look to identify causes for depressed mood and learn coping skills for reduction of depression.  Interventions including using cognitive behavioral therapy to help her explore and replace unhealthy thoughts and behaviors contributing to depression, processed how she experiences depression daily, provide education about depression to help her identify its causes and symptoms as well as sharing feelings of depression.  We will teach and encouraged the use of coping skills for management of depression.  Goals for anxiety are to improve her ability to manage anxiety symptoms,  panic symptoms and better handle stress.  We will identify causes for anxiety and panic and explore ways to reduce it as well as resolving and processing core conflicts that are contributing to anxiety and panic.  We will also work on managing worrisome thoughts contributing to feelings of anxiety and stress.  Interventions include providing education about anxiety to help her understand and identify its  causes and symptoms, facilitate problem solution skills for reducing stress and anxiety as well as teach coping skills to manage anxiety such as grounding exercises, progressive muscle relaxation etc.  We will also use cognitive behavioral therapy to identify and change anxiety producing thoughts and behaviors as well as teach dialectical behavior therapy distress tolerance and mindfulness skills to help her learn anxiety management skills.  Goals for reducing trauma responses for PTSD include helping the patient recall the trauma without feeling overwhelmed or consumed with negative emotion as well as helping her to return to a pre trauma level of functioning.  Interventions include helping the patient identify the traumatic events impact her daily life as well as process feelings associated with the trauma including guilt and shame and sadness.  We will explore and as much detail as she is comfortable with sharing feelings regarding the trauma allowing for the gradual reduction of the emotional response through storytelling.  We will explore and reframe the patient's negative self talk associated with the trauma and identify negative self-defeating thoughts replacing it with positive thoughts and talk.  Lastly we will teach relaxation techniques to help her manage anxiety associated with the trauma.  French Ana, [facility]                         French Ana, [facility]               French Ana, [facility]               French Ana, [facility]               French Ana, [facility]               French Ana, [facility]               French Ana, [facility]               French Ana, [facility]       French Ana, [facility]     French Ana, [facility]               French Ana, [facility]               French Ana, [facility]               French Ana,  [facility]               French Ana, [facility]                      French Ana, [facility]               French Ana, [facility]               French Ana, [facility]               French Ana, [facility]               French Ana, [facility] [place] [facility]  French Ana, [facility]               French Ana, [facility]               French Ana, [facility]               French Ana, [facility]               French Ana, [facility]               French Ana, [facility]               French Ana, [facility]       French Ana, [facility]               French Ana, [facility]               French Ana, [facility]               French Ana, [facility]

## 2023-09-22 ENCOUNTER — Encounter: Payer: Self-pay | Admitting: Behavioral Health

## 2023-09-22 ENCOUNTER — Ambulatory Visit: Payer: BC Managed Care – PPO | Admitting: Behavioral Health

## 2023-09-22 DIAGNOSIS — F4310 Post-traumatic stress disorder, unspecified: Secondary | ICD-10-CM | POA: Diagnosis not present

## 2023-09-22 NOTE — Progress Notes (Addendum)
[facility] Counselor/Therapist Progress Note  Patient ID: Tara Jones, MRN: [id number],    Date: 09/22/23  Time Spent: 2:07 PM until 3:01 PM, 54 minutes.  This session was held via video teletherapy. The patient consented to the video teletherapy and was located in her office during this session. She is aware it is the responsibility of the patient to secure confidentiality on her end of the session. The provider was in a private home office for the duration of this session.    The patient arrived on time for her Caregility session   Treatment Type: Individual Therapy  Reported Symptoms: Anxiety/stress, depression  Mental Status Exam: Appearance:  Casual and Well Groomed     Behavior: Appropriate  Motor: Normal  Speech/Language:  Normal Rate  Affect: Appropriate  Mood: normal  Thought process: normal  Thought content:   WNL  Sensory/Perceptual disturbances:   WNL  Orientation: oriented to person, place, time/date, situation, day of week, month of year, and year  Attention: Good  Concentration: Good  Memory: WNL  Fund of knowledge:  Good  Insight:   Good  Judgment:  Good  Impulse Control: Good   Risk Assessment: Danger to Self:  No Self-injurious Behavior: No Danger to Others: No Duty to Warn:no Physical Aggression / Violence:No  Access to Firearms a concern: No  Gang Involvement:No   Subjective: The patient is flying out in the morning to go to [place] with her parents as well as meeting her brother and sister-in-law for a few days of better over Thanksgiving.  She acknowledged that committing to doing this before she had her current job and there is some anxiety about going because of spending that much time with her family.  We looked at specific relationship situations with family members and talked about the importance of boundaries both verbal emotional physical geographical etc.  We also looked at her work situation and how she is  approaching all aspects of her work including what she does the results that she is getting relationship with coworkers etc. Interventions: Cognitive Behavioral Therapy  Diagnosis: Posttraumatic stress disorder  Plan: I will meet with the patient every week primarily  via care agility Progress: 30% Treatment plan: We will use cognitive behavioral therapy as well as elements of ego supportive therapy and dialectical behavior therapy with a goal of reducing the patient's anxiety and depression by at least 50% by November 27, 2023.  Goals for depression include the patient having less depression as evidenced by her report in session as well as PHQ-9 scores, to have improved mood and for her to return to a healthier level of functioning.  We will look to identify causes for depressed mood and learn coping skills for reduction of depression.  Interventions including using cognitive behavioral therapy to help her explore and replace unhealthy thoughts and behaviors contributing to depression, processed how she experiences depression daily, provide education about depression to help her identify its causes and symptoms as well as sharing feelings of depression.  We will teach and encouraged the use of coping skills for management of depression.  Goals for anxiety are to improve her ability to manage anxiety symptoms, panic symptoms and better handle stress.  We will identify causes for anxiety and panic and explore ways to reduce it as well as resolving and processing core conflicts that are contributing to anxiety and panic.  We will also work on managing worrisome thoughts contributing to feelings of anxiety and stress.  Interventions  include providing education about anxiety to help her understand and identify its causes and symptoms, facilitate problem solution skills for reducing stress and anxiety as well as teach coping skills to manage anxiety such as grounding exercises, progressive muscle relaxation etc.   We will also use cognitive behavioral therapy to identify and change anxiety producing thoughts and behaviors as well as teach dialectical behavior therapy distress tolerance and mindfulness skills to help her learn anxiety management skills.  Goals for reducing trauma responses for PTSD include helping the patient recall the trauma without feeling overwhelmed or consumed with negative emotion as well as helping her to return to a pre trauma level of functioning.  Interventions include helping the patient identify the traumatic events impact her daily life as well as process feelings associated with the trauma including guilt and shame and sadness.  We will explore and as much detail as she is comfortable with sharing feelings regarding the trauma allowing for the gradual reduction of the emotional response through storytelling.  We will explore and reframe the patient's negative self talk associated with the trauma and identify negative self-defeating thoughts replacing it with positive thoughts and talk.  Lastly we will teach relaxation techniques to help her manage anxiety associated with the trauma.  French Ana, [facility]                         French Ana, [facility]               French Ana, [facility]               French Ana, [facility] [address] [facility]               French Ana, [facility]               French Ana, [facility]               French Ana, [facility]               French Ana, [facility]       French Ana, [facility]     French Ana, [facility]               French Ana, [facility]               French Ana, [facility]               French Ana, [facility]               French Ana, [facility]                      French Ana, [facility]               French Ana, [facility]               French Ana, [facility]               French Ana, [facility]               French Ana, [facility]               French Ana, [facility]               French Ana, [facility]               French Ana, [facility]  French Ana, [facility]               French Ana, [facility]               French Ana, [facility]               French Ana, [facility]       French Ana, [facility]               French Ana, [facility]               French Ana, [facility]               French Ana, [facility]               French Ana, [facility]

## 2023-10-01 ENCOUNTER — Ambulatory Visit: Payer: BC Managed Care – PPO | Admitting: Behavioral Health

## 2023-10-01 ENCOUNTER — Encounter: Payer: Self-pay | Admitting: Behavioral Health

## 2023-10-01 DIAGNOSIS — F4310 Post-traumatic stress disorder, unspecified: Secondary | ICD-10-CM | POA: Diagnosis not present

## 2023-10-01 NOTE — Progress Notes (Signed)
[facility] Counselor/Therapist Progress Note  Patient ID: Tara Jones, MRN: [id number],    Date: 10/01/23  Time Spent.  This session was held via video teletherapy. The patient consented to the video teletherapy and was located in her office during this session. She is aware it is the responsibility of the patient to secure confidentiality on her end of the session. The provider was in a private home office for the duration of this session.    The patient arrived on time for her Caregility session   Treatment Type: Individual Therapy  Reported Symptoms: Anxiety/stress, depression  Mental Status Exam: Appearance:  Casual and Well Groomed     Behavior: Appropriate  Motor: Normal  Speech/Language:  Normal Rate  Affect: Appropriate  Mood: normal  Thought process: normal  Thought content:   WNL  Sensory/Perceptual disturbances:   WNL  Orientation: oriented to person, place, time/date, situation, day of week, month of year, and year  Attention: Good  Concentration: Good  Memory: WNL  Fund of knowledge:  Good  Insight:   Good  Judgment:  Good  Impulse Control: Good   Risk Assessment: Danger to Self:  No Self-injurious Behavior: No Danger to Others: No Duty to Warn:no Physical Aggression / Violence:No  Access to Firearms a concern: No  Gang Involvement:No   Subjective: There were good and bad things about the trip to [place].  Being inside close proximity to her mother for that was the time was difficult.  He sat in the rain at the a relief from hours.  Otherwise the trip was good.  She had a good time with her brother and sister-in-law.  Had a nice dinner at an [place] that she likes.  She did get bronchitis while they are in effect her daughter was sick from staying at the kennel.  Both of them are still recovering.  The most part she managed the flights of them back fairly well with some mild panic flight at home.  Work is still going well in  terms of production.  She does have staff meeting next week which there is some anxiety about because to Christmas party and will be the hearing review from her manager.  Her develop what she looks and feels like in relation to the year and review of the work that she has done relationships with coworkers etc.  We continue to work on building her self-image and self-esteem. Interventions: Cognitive Behavioral Therapy  Diagnosis: Posttraumatic stress disorder  Plan: I will meet with the patient every week primarily  via care agility Progress: 30% Treatment plan: We will use cognitive behavioral therapy as well as elements of ego supportive therapy and dialectical behavior therapy with a goal of reducing the patient's anxiety and depression by at least 50% by November 27, 2023.  Goals for depression include the patient having less depression as evidenced by her report in session as well as PHQ-9 scores, to have improved mood and for her to return to a healthier level of functioning.  We will look to identify causes for depressed mood and learn coping skills for reduction of depression.  Interventions including using cognitive behavioral therapy to help her explore and replace unhealthy thoughts and behaviors contributing to depression, processed how she experiences depression daily, provide education about depression to help her identify its causes and symptoms as well as sharing feelings of depression.  We will teach and encouraged the use of coping skills for management of depression.  Goals  for anxiety are to improve her ability to manage anxiety symptoms, panic symptoms and better handle stress.  We will identify causes for anxiety and panic and explore ways to reduce it as well as resolving and processing core conflicts that are contributing to anxiety and panic.  We will also work on managing worrisome thoughts contributing to feelings of anxiety and stress.  Interventions include providing education  about anxiety to help her understand and identify its causes and symptoms, facilitate problem solution skills for reducing stress and anxiety as well as teach coping skills to manage anxiety such as grounding exercises, progressive muscle relaxation etc.  We will also use cognitive behavioral therapy to identify and change anxiety producing thoughts and behaviors as well as teach dialectical behavior therapy distress tolerance and mindfulness skills to help her learn anxiety management skills.  Goals for reducing trauma responses for PTSD include helping the patient recall the trauma without feeling overwhelmed or consumed with negative emotion as well as helping her to return to a pre trauma level of functioning.  Interventions include helping the patient identify the traumatic events impact her daily life as well as process feelings associated with the trauma including guilt and shame and sadness.  We will explore and as much detail as she is comfortable with sharing feelings regarding the trauma allowing for the gradual reduction of the emotional response through storytelling.  We will explore and reframe the patient's negative self talk associated with the trauma and identify negative self-defeating thoughts replacing it with positive thoughts and talk.  Lastly we will teach relaxation techniques to help her manage anxiety associated with the trauma.  French Ana, [facility]                         French Ana, [facility]               French Ana, [facility]               French Ana, [facility]               French Ana, [facility]               French Ana, [facility]               French Ana, [facility]               French Ana, [facility]       French Ana, [facility]     French Ana, [facility]               French Ana, [facility]               French Ana,  [facility]               French Ana, [facility]               French Ana, [facility]                      French Ana, [facility]               French Ana, [facility]               French Ana, [facility]               French Ana, [facility]  French Ana, [facility]               French Ana, [facility]               French Ana, [facility]               French Ana, [facility]               French Ana, [facility]               French Ana, [facility]               French Ana, [facility]               French Ana, [facility]       French Ana, [facility]               French Ana, [facility]               French Ana, [facility]               French Ana, [facility]               French Ana, [facility]               French Ana, [facility]

## 2023-10-08 ENCOUNTER — Ambulatory Visit: Payer: BC Managed Care – PPO | Admitting: Behavioral Health

## 2023-10-15 ENCOUNTER — Ambulatory Visit: Payer: BC Managed Care – PPO | Admitting: Behavioral Health

## 2023-10-15 DIAGNOSIS — F4310 Post-traumatic stress disorder, unspecified: Secondary | ICD-10-CM | POA: Diagnosis not present

## 2023-10-15 DIAGNOSIS — F4010 Social phobia, unspecified: Secondary | ICD-10-CM

## 2023-10-16 ENCOUNTER — Encounter: Payer: Self-pay | Admitting: Behavioral Health

## 2023-10-16 NOTE — Progress Notes (Signed)
[facility] Counselor/Therapist Progress Note  Patient ID: Tara Jones, MRN: [id number],    Date: 10/15/23  Time Spent.  4 PM until 5 PM, 60 minutes.  This session was held via video teletherapy. The patient consented to the video teletherapy and was located in her office during this session. She is aware it is the responsibility of the patient to secure confidentiality on her end of the session. The provider was in a private home office for the duration of this session.    The patient arrived on time for her Caregility session   Treatment Type: Individual Therapy  Reported Symptoms: Anxiety/stress, depression  Mental Status Exam: Appearance:  Casual and Well Groomed     Behavior: Appropriate  Motor: Normal  Speech/Language:  Normal Rate  Affect: Appropriate  Mood: normal  Thought process: normal  Thought content:   WNL  Sensory/Perceptual disturbances:   WNL  Orientation: oriented to person, place, time/date, situation, day of week, month of year, and year  Attention: Good  Concentration: Good  Memory: WNL  Fund of knowledge:  Good  Insight:   Good  Judgment:  Good  Impulse Control: Good   Risk Assessment: Danger to Self:  No Self-injurious Behavior: No Danger to Others: No Duty to Warn:no Physical Aggression / Violence:No  Access to Firearms a concern: No  Gang Involvement:No   Subjective: The patient's holiday dinner with her new employer went well.  She continues to grow in confidence and comfort with her coworkers.  Even though she has not been there a year yet she received a bonus and increase which she was very thankful for.  In addition to her new car there have been significant financial expenses especially related to her dog who will have to go on a special dog food starting soon.  She will be spending Christmas with her parents and brothers and their wives.  For the most part she is managing her anxiety in anticipation of that.  She will  do her [employer] as a play on Christmas Day.  Encouraged her to set boundaries with her family and the time that she spends around them.  She continues to have some social anxiety but is getting out more and we will schedule more visits and office as a way to get her out of her home more consistently.  We will continue to work on anxiety reduction.  Interventions: Cognitive Behavioral Therapy  Diagnosis: Posttraumatic stress disorder  Plan: I will meet with the patient every week primarily  via care agility Progress: 30% Treatment plan: We will use cognitive behavioral therapy as well as elements of ego supportive therapy and dialectical behavior therapy with a goal of reducing the patient's anxiety and depression by at least 50% by November 27, 2023.  Goals for depression include the patient having less depression as evidenced by her report in session as well as PHQ-9 scores, to have improved mood and for her to return to a healthier level of functioning.  We will look to identify causes for depressed mood and learn coping skills for reduction of depression.  Interventions including using cognitive behavioral therapy to help her explore and replace unhealthy thoughts and behaviors contributing to depression, processed how she experiences depression daily, provide education about depression to help her identify its causes and symptoms as well as sharing feelings of depression.  We will teach and encouraged the use of coping skills for management of depression.  Goals for anxiety are to improve  her ability to manage anxiety symptoms, panic symptoms and better handle stress.  We will identify causes for anxiety and panic and explore ways to reduce it as well as resolving and processing core conflicts that are contributing to anxiety and panic.  We will also work on managing worrisome thoughts contributing to feelings of anxiety and stress.  Interventions include providing education about anxiety to  help her understand and identify its causes and symptoms, facilitate problem solution skills for reducing stress and anxiety as well as teach coping skills to manage anxiety such as grounding exercises, progressive muscle relaxation etc.  We will also use cognitive behavioral therapy to identify and change anxiety producing thoughts and behaviors as well as teach dialectical behavior therapy distress tolerance and mindfulness skills to help her learn anxiety management skills.  Goals for reducing trauma responses for PTSD include helping the patient recall the trauma without feeling overwhelmed or consumed with negative emotion as well as helping her to return to a pre trauma level of functioning.  Interventions include helping the patient identify the traumatic events impact her daily life as well as process feelings associated with the trauma including guilt and shame and sadness.  We will explore and as much detail as she is comfortable with sharing feelings regarding the trauma allowing for the gradual reduction of the emotional response through storytelling.  We will explore and reframe the patient's negative self talk associated with the trauma and identify negative self-defeating thoughts replacing it with positive thoughts and talk.  Lastly we will teach relaxation techniques to help her manage anxiety associated with the trauma.  [place], [facility]                         [place], [facility]               [place], [facility]               [place], [facility]               [place], [facility]               [place], [facility]               French [place], [facility]               [place], [facility]       [place], [facility]     [place], [facility]               [place], [facility]               [place],  [facility]               [place], [facility]               [place], [facility]                      French [place], [facility]               French [place], [facility]               French [place], [facility]               French [place], [facility]  French Ana, [facility]               French [place], [facility]               [place], [facility]               French [place], [facility]               [place], [facility]               French [place], [facility]               [place], [facility]               [place], [facility]       French Ana, [facility]               French [place], [facility]               French [place], [facility]               French [place], [facility]               French [place], [facility]               [place], [facility]               [place], [facility]

## 2023-11-06 ENCOUNTER — Telehealth: Payer: Self-pay | Admitting: Family Medicine

## 2023-11-06 DIAGNOSIS — J4540 Moderate persistent asthma, uncomplicated: Secondary | ICD-10-CM

## 2023-11-06 MED ORDER — ALBUTEROL SULFATE HFA 108 (90 BASE) MCG/ACT IN AERS: 2.0000 | INHALATION_SPRAY | Freq: Four times a day (QID) | RESPIRATORY_TRACT | 0 refills | Status: AC | PRN

## 2023-11-06 NOTE — Progress Notes (Signed)
 Virtual Visit Consent   Tara Jones, you are scheduled for a virtual visit with a [facility] provider today. Just as with appointments in the office, your consent must be obtained to participate. Your consent will be active for this visit and any virtual visit you may have with one of our providers in the next 365 days. If you have a MyChart account, a copy of this consent can be sent to you electronically.  As this is a virtual visit, video technology does not allow for your provider to perform a traditional examination. This may limit your provider's ability to fully assess your condition. If your provider identifies any concerns that need to be evaluated in person or the need to arrange testing (such as labs, EKG, etc.), we will make arrangements to do so. Although advances in technology are sophisticated, we cannot ensure that it will always work on either your end or our end. If the connection with a video visit is poor, the visit may have to be switched to a telephone visit. With either a video or telephone visit, we are not always able to ensure that we have a secure connection.  By engaging in this virtual visit, you consent to the provision of healthcare and authorize for your insurance to be billed (if applicable) for the services provided during this visit. Depending on your insurance coverage, you may receive a charge related to this service.  I need to obtain your verbal consent now. Are you willing to proceed with your visit today? Tara Jones has provided verbal consent on 11/06/2023 for a virtual visit (video or telephone). Loa Lamp, FNP  Date: 11/06/2023 9:35 AM  Virtual Visit via Video Note   I, Loa Lamp, connected with  Tara Jones  ([id number], [birth year]) on 11/06/23 at  9:30 AM EST by a video-enabled telemedicine application and verified that I am speaking with the correct person using two identifiers.  Location: Patient: Virtual Visit Location Patient:  Home Provider: Virtual Visit Location Provider: Home Office   I discussed the limitations of evaluation and management by telemedicine and the availability of in person appointments. The patient expressed understanding and agreed to proceed.    History of Present Illness: Tara Jones is a 33 y.o. who identifies as a female who was assigned female at birth, and is being seen today for flare of asthma due to allergies from cat and dog in home. She requests refill of ventolin  inhaler In no distress. Tara Jones  HPI: HPI  Problems: There are no active problems to display for this patient.   Allergies:  Allergies  Allergen Reactions   Doxycycline Anaphylaxis   Penicillins Anaphylaxis    Childhood reaction   Amoxicillin Hives   Cephalexin Hives   Erythromycin Nausea And Vomiting   Medications:  Current Outpatient Medications:    albuterol  (VENTOLIN  HFA) 108 (90 Base) MCG/ACT inhaler, Inhale 2 puffs into the lungs every 6 (six) hours as needed for wheezing or shortness of breath., Disp: 8 g, Rfl: 0   ALBUTEROL  IN, Inhale into the lungs., Disp: , Rfl:    buPROPion ER (WELLBUTRIN SR) 100 MG 12 hr tablet, buPROPion HCl, Disp: , Rfl:    dicyclomine  (BENTYL ) 20 MG tablet, Take 1 tablet (20 mg total) by mouth 2 (two) times daily., Disp: 20 tablet, Rfl: 0   fluticasone  (FLONASE ) 50 MCG/ACT nasal spray, Place 2 sprays into the nose daily., Disp: 16 g, Rfl: 6   ibuprofen  (ADVIL ,MOTRIN ) 200 MG tablet, Take  400 mg by mouth every 6 (six) hours as needed., Disp: , Rfl:    ibuprofen  (ADVIL ,MOTRIN ) 800 MG tablet, Take 1 tablet (800 mg total) by mouth 3 (three) times daily., Disp: 21 tablet, Rfl: 0   Loteprednol-Tobramycin (ZYLET OP), Apply to eye., Disp: , Rfl:    methylphenidate (RITALIN LA) 40 MG 24 hr capsule, Take by mouth., Disp: , Rfl:    methylPREDNISolone  (MEDROL  DOSEPAK) 4 MG TBPK tablet, Take per package instructions, Disp: 1 each, Rfl: 0   metroNIDAZOLE  (FLAGYL ) 500 MG tablet, Take 1 tablet (500  mg total) by mouth 2 (two) times daily., Disp: 14 tablet, Rfl: 0   pantoprazole  (PROTONIX ) 40 MG tablet, Take 1 tablet (40 mg total) by mouth daily., Disp: 30 tablet, Rfl: 3   promethazine  (PHENERGAN ) 12.5 MG tablet, Take 1 tablet (12.5 mg total) by mouth every 6 (six) hours as needed for nausea or vomiting., Disp: 30 tablet, Rfl: 0   traZODone (DESYREL) 50 MG tablet, traZODone HCl, Disp: , Rfl:   Observations/Objective: Patient is well-developed, well-nourished in no acute distress.  Resting comfortably  at home.  Head is normocephalic, atraumatic.  No labored breathing.  Speech is clear and coherent with logical content.  Patient is alert and oriented at baseline.    Assessment and Plan: 1. Moderate persistent asthma without complication (Primary)  Meds refilled, UC if sx worsen.   Follow Up Instructions: I discussed the assessment and treatment plan with the patient. The patient was provided an opportunity to ask questions and all were answered. The patient agreed with the plan and demonstrated an understanding of the instructions.  A copy of instructions were sent to the patient via MyChart unless otherwise noted below.     The patient was advised to call back or seek an in-person evaluation if the symptoms worsen or if the condition fails to improve as anticipated.    Derotha Fishbaugh, FNP

## 2023-11-06 NOTE — Patient Instructions (Signed)
 Asthma, Adult  Asthma is a long-term (chronic) condition that causes recurrent episodes in which the lower airways in the lungs become tight and narrow. The narrowing is caused by inflammation and tightening of the smooth muscle around the lower airways. Asthma episodes, also called asthma attacks or asthma flares, may cause coughing, making high-pitched whistling sounds when you breathe, most often when you breathe out (wheezing), shortness of breath, and chest pain. The airways may produce extra mucus caused by the inflammation and irritation. During an attack, it can be difficult to breathe. Asthma attacks can range from minor to life-threatening. Asthma cannot be cured, but medicines and lifestyle changes can help control it and treat acute attacks. It is important to keep your asthma well controlled so the condition does not interfere with your daily life. What are the causes? This condition is believed to be caused by inherited (genetic) and environmental factors, but its exact cause is not known. What can trigger an asthma attack? Many things can bring on an asthma attack or make symptoms worse. These triggers are different for every person. Common triggers include: Allergens and irritants like mold, dust, pet dander, cockroaches, pollen, air pollution, and chemical odors. Cigarette smoke. Weather changes and cold air. Stress and strong emotional responses such as crying or laughing hard. Certain medications such as aspirin or beta blockers. Infections and inflammatory conditions, such as the flu, a cold, pneumonia, or inflammation of the nasal membranes (rhinitis). Gastroesophageal reflux disease (GERD). What are the signs or symptoms? Symptoms may occur right after exposure to an asthma trigger or hours later and can vary by person. Common signs and symptoms include: Wheezing. Trouble breathing (shortness of breath). Excessive nighttime or early morning coughing. Chest  tightness. Tiredness (fatigue) with minimal activity. Difficulty talking in complete sentences. Poor exercise tolerance. How is this diagnosed? This condition is diagnosed based on: A physical exam and your medical history. Tests, which may include: Lung function studies to evaluate the flow of air in your lungs. Allergy tests. Imaging tests, such as X-rays. How is this treated? There is no cure, but symptoms can be controlled with proper treatment. Treatment usually involves: Identifying and avoiding your asthma triggers. Inhaled medicines. Two types are commonly used to treat asthma, depending on severity: Controller medicines. These help prevent asthma symptoms from occurring. They are taken every day. Fast-acting reliever or rescue medicines. These quickly relieve asthma symptoms. They are used as needed and provide short-term relief. Using other medicines, such as: Allergy medicines, such as antihistamines, if your asthma attacks are triggered by allergens. Immune medicines (immunomodulators). These are medicines that help control the immune system. Using supplemental oxygen. This is only needed during a severe episode. Creating an asthma action plan. An asthma action plan is a written plan for managing and treating your asthma attacks. This plan includes: A list of your asthma triggers and how to avoid them. Information about when medicines should be taken and when their dosage should be changed. Instructions about using a device called a peak flow meter. A peak flow meter measures how well the lungs are working and the severity of your asthma. It helps you monitor your condition. Follow these instructions at home: Take over-the-counter and prescription medicines only as told by your health care provider. Stay up to date on all vaccinations as recommended by your healthcare provider, including vaccines for the flu and pneumonia. Use a peak flow meter and keep track of your peak flow  readings. Understand and use your asthma  action plan to address any asthma flares. Do not smoke or allow anyone to smoke in your home. Contact a health care provider if: You have wheezing, shortness of breath, or a cough that is not responding to medicines. Your medicines are causing side effects, such as a rash, itching, swelling, or trouble breathing. You need to use a reliever medicine more than 2-3 times a week. Your peak flow reading is still at 50-79% of your personal best after following your action plan for 1 hour. You have a fever and shortness of breath. Get help right away if: You are getting worse and do not respond to treatment during an asthma attack. You are short of breath when at rest or when doing very little physical activity. You have difficulty eating, drinking, or talking. You have chest pain or tightness. You develop a fast heartbeat or palpitations. You have a bluish color to your lips or fingernails. You are light-headed or dizzy, or you faint. Your peak flow reading is less than 50% of your personal best. You feel too tired to breathe normally. These symptoms may be an emergency. Get help right away. Call 911. Do not wait to see if the symptoms will go away. Do not drive yourself to the hospital. Summary Asthma is a long-term (chronic) condition that causes recurrent episodes in which the airways become tight and narrow. Asthma episodes, also called asthma attacks or asthma flares, can cause coughing, wheezing, shortness of breath, and chest pain. Asthma cannot be cured, but medicines and lifestyle changes can help keep it well controlled and prevent asthma flares. Make sure you understand how to avoid triggers and how and when to use your medicines. Asthma attacks can range from minor to life-threatening. Get help right away if you have an asthma attack and do not respond to treatment with your usual rescue medicines. This information is not intended to replace  advice given to you by your health care provider. Make sure you discuss any questions you have with your health care provider. Document Revised: 07/31/2021 Document Reviewed: 07/22/2021 Elsevier Patient Education  2024 [employer].

## 2023-11-13 ENCOUNTER — Encounter: Payer: Self-pay | Admitting: Behavioral Health

## 2023-11-13 ENCOUNTER — Ambulatory Visit (INDEPENDENT_AMBULATORY_CARE_PROVIDER_SITE_OTHER): Payer: BC Managed Care – PPO | Admitting: Behavioral Health

## 2023-11-13 DIAGNOSIS — F4310 Post-traumatic stress disorder, unspecified: Secondary | ICD-10-CM

## 2023-11-13 NOTE — Progress Notes (Signed)
[facility] Counselor/Therapist Progress Note  Patient ID: Tara Jones, MRN: [id number],    Date: 11/13/23  Time Spent.  1 PM until 2 PM, 60 minutes.  This session was held via video teletherapy. The patient consented to the video teletherapy and was located in her office during this session. She is aware it is the responsibility of the patient to secure confidentiality on her end of the session. The provider was in a private home office for the duration of this session.    The patient arrived on time for her Caregility session   Treatment Type: Individual Therapy  Reported Symptoms: Anxiety/stress, depression  Mental Status Exam: Appearance:  Casual and Well Groomed     Behavior: Appropriate  Motor: Normal  Speech/Language:  Normal Rate  Affect: Appropriate  Mood: normal  Thought process: normal  Thought content:   WNL  Sensory/Perceptual disturbances:   WNL  Orientation: oriented to person, place, time/date, situation, day of week, month of year, and year  Attention: Good  Concentration: Good  Memory: WNL  Fund of knowledge:  Good  Insight:   Good  Judgment:  Good  Impulse Control: Good   Risk Assessment: Danger to Self:  No Self-injurious Behavior: No Danger to Others: No Duty to Warn:no Physical Aggression / Violence:No  Access to Firearms a concern: No  Gang Involvement:No   Subjective: The patient reported that she did spend a lot of time with family over the holidays.  Although there were a few good minutes she said for the most part her family ignored her or did not necessarily validate her and she said that has been a pattern since she was little.  She reports that her mother is always been critical especially of the way the patient dressed but said she recognized recently that her mother only evaluate her for her looks.  She remembers as a 33-year-old her mother picking her up and setting her on the counter looking in a mirror and saying you  look just like me.  The patient said she remembers even 33 years old being offended by that and saying that she did not.  She remembers other instances even when she is 11 and 33 years old where everything was focused on her looks and what that might bring her mother and father even getting her to the gym at 4:00 in the morning when she already had an eating disorder.  Even over the holidays she went to see play with her mother.  They ran into some of her mother's friends and they commented on how beautiful the patient was and she could tell the mother took pride in that.  She says she has had several realizations the biggest being that she does not deserve to be treated the way that she has been treated over the years and is beginning to set even firmer boundaries mentally emotionally physically verbally etc.  We also looked at other relationships and the importance of self-care and setting boundaries in those relationships in relation to her growth.  Out validated the fact that she had worked hard and was very proud of the self-care that she was now administering and will be doing so in the future.   Interventions: Cognitive Behavioral Therapy  Diagnosis: Posttraumatic stress disorder  Plan: I will meet with the patient every week primarily  via care agility Progress: 30% Treatment plan: We will use cognitive behavioral therapy as well as elements of ego supportive therapy and dialectical behavior  therapy with a goal of reducing the patient's anxiety and depression by at least 50% by November 27, 2023.  Goals for depression include the patient having less depression as evidenced by her report in session as well as PHQ-9 scores, to have improved mood and for her to return to a healthier level of functioning.  We will look to identify causes for depressed mood and learn coping skills for reduction of depression.  Interventions including using cognitive behavioral therapy to help her explore and replace  unhealthy thoughts and behaviors contributing to depression, processed how she experiences depression daily, provide education about depression to help her identify its causes and symptoms as well as sharing feelings of depression.  We will teach and encouraged the use of coping skills for management of depression.  Goals for anxiety are to improve her ability to manage anxiety symptoms, panic symptoms and better handle stress.  We will identify causes for anxiety and panic and explore ways to reduce it as well as resolving and processing core conflicts that are contributing to anxiety and panic.  We will also work on managing worrisome thoughts contributing to feelings of anxiety and stress.  Interventions include providing education about anxiety to help her understand and identify its causes and symptoms, facilitate problem solution skills for reducing stress and anxiety as well as teach coping skills to manage anxiety such as grounding exercises, progressive muscle relaxation etc.  We will also use cognitive behavioral therapy to identify and change anxiety producing thoughts and behaviors as well as teach dialectical behavior therapy distress tolerance and mindfulness skills to help her learn anxiety management skills.  Goals for reducing trauma responses for PTSD include helping the patient recall the trauma without feeling overwhelmed or consumed with negative emotion as well as helping her to return to a pre trauma level of functioning.  Interventions include helping the patient identify the traumatic events impact her daily life as well as process feelings associated with the trauma including guilt and shame and sadness.  We will explore and as much detail as she is comfortable with sharing feelings regarding the trauma allowing for the gradual reduction of the emotional response through storytelling.  We will explore and reframe the patient's negative self talk associated with the trauma and identify  negative self-defeating thoughts replacing it with positive thoughts and talk.  Lastly we will teach relaxation techniques to help her manage anxiety associated with the trauma.  French [place], [facility]                         French Ana, [facility]               French Ana, [facility]               French Ana, [facility]               French Ana, [facility]               French Ana, [facility]               French Ana, [facility]               French Ana, [facility]       French Ana, [facility]     French Ana, [facility]               French Ana, [facility]  French Ana, [facility]               French Ana, [facility]               French Ana, [facility]                      French Ana, [facility]               French Ana, [facility]               French Ana, [facility]               French Ana, [facility]               French Ana, [facility]               French Ana, [facility]               French Ana, [facility]               French Ana, [facility]               French Ana, [facility]               French Ana, [facility]               French Ana, [facility]               French Ana, [facility]       French Ana, [facility]               French Ana, [facility]               French Ana, [facility]               French Ana, [facility]               French Ana, [facility]               French Ana, [facility]               French Ana, [facility]  [facility] Counselor/Therapist Progress Note  Patient ID: Tara Jones, MRN: [id number],    Date:  11/13/23  Time Spent. 1pm to 1:58 pm 58 minutes.  This session was held via video teletherapy. The patient consented to the video teletherapy and was located in her office during this session. She is aware it is the responsibility of the patient to secure confidentiality on her end of the session. The provider was in a private home office for the duration of this session.    The patient arrived on time for her Caregility session   Treatment Type: Individual Therapy  Reported Symptoms: Anxiety/stress, depression  Mental Status Exam: Appearance:  Casual and Well Groomed     Behavior: Appropriate  Motor: Normal  Speech/Language:  Normal Rate  Affect: Appropriate  Mood: normal  Thought process: normal  Thought content:   WNL  Sensory/Perceptual disturbances:   WNL  Orientation: oriented to person, place, time/date, situation, day of week, month of year, and year  Attention: Good  Concentration: Good  Memory: WNL  Fund of knowledge:  Good  Insight:   Good  Judgment:  Good  Impulse Control: Good   Risk Assessment: Danger to Self:  No Self-injurious Behavior: No Danger to Others: No Duty to Warn:no  Physical Aggression / Violence:No  Access to Firearms a concern: No  Gang Involvement:No   Subjective: The patient's holiday dinner with her new employer went well.  She continues to grow in confidence and comfort with her coworkers.  Even though she has not been there a year yet she received a bonus and increase which she was very thankful for.  In addition to her new car there have been significant financial expenses especially related to her dog who will have to go on a special dog food starting soon.  She will be spending Christmas with her parents and brothers and their wives.  For the most part she is managing her anxiety in anticipation of that.  She will do her [employer] as a play on Christmas Day.  Encouraged her to set boundaries with her family and the time that she  spends around them.  She continues to have some social anxiety but is getting out more and we will schedule more visits and office as a way to get her out of her home more consistently.  We will continue to work on anxiety reduction.  Interventions: Cognitive Behavioral Therapy  Diagnosis: Posttraumatic stress disorder  Plan: I will meet with the patient every week primarily  via care agility Progress: 30% Treatment plan: We will use cognitive behavioral therapy as well as elements of ego supportive therapy and dialectical behavior therapy with a goal of reducing the patient's anxiety and depression by at least 50% by November 27, 2023.  Goals for depression include the patient having less depression as evidenced by her report in session as well as PHQ-9 scores, to have improved mood and for her to return to a healthier level of functioning.  We will look to identify causes for depressed mood and learn coping skills for reduction of depression.  Interventions including using cognitive behavioral therapy to help her explore and replace unhealthy thoughts and behaviors contributing to depression, processed how she experiences depression daily, provide education about depression to help her identify its causes and symptoms as well as sharing feelings of depression.  We will teach and encouraged the use of coping skills for management of depression.  Goals for anxiety are to improve her ability to manage anxiety symptoms, panic symptoms and better handle stress.  We will identify causes for anxiety and panic and explore ways to reduce it as well as resolving and processing core conflicts that are contributing to anxiety and panic.  We will also work on managing worrisome thoughts contributing to feelings of anxiety and stress.  Interventions include providing education about anxiety to help her understand and identify its causes and symptoms, facilitate problem solution skills for reducing stress and anxiety as  well as teach coping skills to manage anxiety such as grounding exercises, progressive muscle relaxation etc.  We will also use cognitive behavioral therapy to identify and change anxiety producing thoughts and behaviors as well as teach dialectical behavior therapy distress tolerance and mindfulness skills to help her learn anxiety management skills.  Goals for reducing trauma responses for PTSD include helping the patient recall the trauma without feeling overwhelmed or consumed with negative emotion as well as helping her to return to a pre trauma level of functioning.  Interventions include helping the patient identify the traumatic events impact her daily life as well as process feelings associated with the trauma including guilt and shame and sadness.  We will explore and as much detail as she is comfortable with sharing feelings regarding  the trauma allowing for the gradual reduction of the emotional response through storytelling.  We will explore and reframe the patient's negative self talk associated with the trauma and identify negative self-defeating thoughts replacing it with positive thoughts and talk.  Lastly we will teach relaxation techniques to help her manage anxiety associated with the trauma.  French Ana, [facility]                         French Ana, [facility]               French Ana, [facility]               French Ana, [facility]               French Ana, [facility]               French Ana, [facility]               French Ana, [facility]               French Ana, [facility]       French Ana, [facility]     French Ana, [facility]               French Ana, [facility]               French Ana, [facility]               French Ana, [facility]               French Ana,  [facility]                      French Ana, [facility]               French Ana, [facility]               French Ana, [facility]               French Ana, [facility]               French Ana, [facility]               French Ana, [facility]               French Ana, [facility]               French Ana, [facility]               French Ana, [facility]               French Ana, [facility]               French Ana, [facility]               French Ana, [facility]       French Ana, [facility]               French Ana, [facility]               French Ana, [facility]               French Ana, [facility]               French Ana, [facility]  French Ana, [facility]               French Ana, [facility]               French Ana, [facility]

## 2023-11-19 ENCOUNTER — Encounter: Payer: Self-pay | Admitting: Behavioral Health

## 2023-11-19 ENCOUNTER — Ambulatory Visit: Payer: BC Managed Care – PPO | Admitting: Behavioral Health

## 2023-11-19 DIAGNOSIS — F4310 Post-traumatic stress disorder, unspecified: Secondary | ICD-10-CM

## 2023-11-19 NOTE — Progress Notes (Addendum)
[facility] Counselor/Therapist Progress Note  Patient ID: Tara Jones, MRN: [id number],    Date: 11/19/23  Time Spent.  1 PM until 2 PM, 60 minutes.  This session was held via video teletherapy. The patient consented to the video teletherapy and was located in her office during this session. She is aware it is the responsibility of the patient to secure confidentiality on her end of the session. The provider was in a private home office for the duration of this session.    The patient arrived on time for her Caregility session   Treatment Type: Individual Therapy  Reported Symptoms: Anxiety/stress, depression  Mental Status Exam: Appearance:  Casual and Well Groomed     Behavior: Appropriate  Motor: Normal  Speech/Language:  Normal Rate  Affect: Appropriate  Mood: normal  Thought process: normal  Thought content:   WNL  Sensory/Perceptual disturbances:   WNL  Orientation: oriented to person, place, time/date, situation, day of week, month of year, and year  Attention: Good  Concentration: Good  Memory: WNL  Fund of knowledge:  Good  Insight:   Good  Judgment:  Good  Impulse Control: Good   Risk Assessment: Danger to Self:  No Self-injurious Behavior: No Danger to Others: No Duty to Warn:no Physical Aggression / Violence:No  Access to Firearms a concern: No  Gang Involvement:No   Subjective: The patient reported that she did spend a lot of time with family over the holidays.  Although there were a few good minutes she said for the most part her family ignored her or did not necessarily validate her and she said that has been a pattern since she was little.  She reports that her mother is always been critical especially of the way the patient dressed but said she recognized recently that her mother only evaluate her for her looks.  She remembers as a 32-year-old her mother picking her up and setting her on the counter looking in a mirror and saying you  look just like me.  The patient said she remembers even 34 years old being offended by that and saying that she did not.  She remembers other instances even when she is 68 and 33 years old where everything was focused on her looks and what that might bring her mother and father even getting her to the gym at 4:00 in the morning when she already had an eating disorder.  Even over the holidays she went to see play with her mother.  They ran into some of her mother's friends and they commented on how beautiful the patient was and she could tell the mother took pride in that.  She says she has had several realizations the biggest being that she does not deserve to be treated the way that she has been treated over the years and is beginning to set even firmer boundaries mentally emotionally physically verbally etc.  We also looked at other relationships and the importance of self-care and setting boundaries in those relationships in relation to her growth.  Out validated the fact that she had worked hard and was very proud of the self-care that she was now administering and will be doing so in the future.   Interventions: Cognitive Behavioral Therapy  Diagnosis: Posttraumatic stress disorder  Plan: I will meet with the patient every week primarily  via care agility Progress: 30% Treatment plan: We will use cognitive behavioral therapy as well as elements of ego supportive therapy and dialectical behavior  therapy with a goal of reducing the patient's anxiety and depression by at least 50% by November 27, 2023.  Goals for depression include the patient having less depression as evidenced by her report in session as well as PHQ-9 scores, to have improved mood and for her to return to a healthier level of functioning.  We will look to identify causes for depressed mood and learn coping skills for reduction of depression.  Interventions including using cognitive behavioral therapy to help her explore and replace  unhealthy thoughts and behaviors contributing to depression, processed how she experiences depression daily, provide education about depression to help her identify its causes and symptoms as well as sharing feelings of depression.  We will teach and encouraged the use of coping skills for management of depression.  Goals for anxiety are to improve her ability to manage anxiety symptoms, panic symptoms and better handle stress.  We will identify causes for anxiety and panic and explore ways to reduce it as well as resolving and processing core conflicts that are contributing to anxiety and panic.  We will also work on managing worrisome thoughts contributing to feelings of anxiety and stress.  Interventions include providing education about anxiety to help her understand and identify its causes and symptoms, facilitate problem solution skills for reducing stress and anxiety as well as teach coping skills to manage anxiety such as grounding exercises, progressive muscle relaxation etc.  We will also use cognitive behavioral therapy to identify and change anxiety producing thoughts and behaviors as well as teach dialectical behavior therapy distress tolerance and mindfulness skills to help her learn anxiety management skills.  Goals for reducing trauma responses for PTSD include helping the patient recall the trauma without feeling overwhelmed or consumed with negative emotion as well as helping her to return to a pre trauma level of functioning.  Interventions include helping the patient identify the traumatic events impact her daily life as well as process feelings associated with the trauma including guilt and shame and sadness.  We will explore and as much detail as she is comfortable with sharing feelings regarding the trauma allowing for the gradual reduction of the emotional response through storytelling.  We will explore and reframe the patient's negative self talk associated with the trauma and identify  negative self-defeating thoughts replacing it with positive thoughts and talk.  Lastly we will teach relaxation techniques to help her manage anxiety associated with the trauma.  French Ana, [facility]                         French Ana, [facility]               French Ana, [facility]               French Ana, [facility]               French Ana, [facility]               French Ana, [facility]               French Ana, [facility]               French Ana, [facility]       French Ana, [facility]     French Ana, [facility]               French Ana, [facility]  French Ana, [facility]               French Ana, [facility]               French Ana, [facility]                      French Ana, [facility]               French Ana, [facility]               French Ana, [facility]               French Ana, [facility]               French Ana, [facility]               French Ana, [facility]               French Ana, [facility]               French Ana, [facility]               French Ana, [facility]               French Ana, [facility]               French Ana, [facility]               French Ana, [facility]       French Ana, [facility]               French Ana, [facility]               French Ana, [facility]               French Ana, [facility]               French Ana, [facility]               French Ana, [facility]               French Ana, [facility]  [facility] Counselor/Therapist Progress Note  Patient ID: Tara Jones, MRN: [id number],    Date:  11/19/23  Time Spent. 3pm to 3:58 pm 58 minutes.  This session was held via video teletherapy. The patient consented to the video teletherapy and was located in her office during this session. She is aware it is the responsibility of the patient to secure confidentiality on her end of the session. The provider was in a private home office for the duration of this session.    The patient arrived on time for her Caregility session   Treatment Type: Individual Therapy  Reported Symptoms: Anxiety/stress, depression  Mental Status Exam: Appearance:  Casual and Well Groomed     Behavior: Appropriate  Motor: Normal  Speech/Language:  Normal Rate  Affect: Appropriate  Mood: normal  Thought process: normal  Thought content:   WNL  Sensory/Perceptual disturbances:   WNL  Orientation: oriented to person, place, time/date, situation, day of week, month of year, and year  Attention: Good  Concentration: Good  Memory: WNL  Fund of knowledge:  Good  Insight:   Good  Judgment:  Good  Impulse Control: Good   Risk Assessment: Danger to Self:  No Self-injurious Behavior: No Danger to Others: No Duty to Warn:no  Physical Aggression / Violence:No  Access to Firearms a concern: No  Gang Involvement:No   Subjective: The patient had a panic attack recently and knows that is connected to what is going on in the country and the change in administration.  She is bothered by a lot of the changes that are taking place and things that are being changed or introduced.  She deleted a lot of her new zaps so she would not become overwhelmed but information came in from different sources including other people and that is what led to her being overwhelmed.  She was able to reduce her anxiety and uses medication if she needs to be encouraged to limitation of exposure to those things which she knows are triggering for her and the importance of use of coping skills to help alleviate or minimize them.  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  It would extend beyond that to which she monitors it as a go to live.  She says right now work is a good distraction from all that is going on politically in the country. Interventions: Cognitive Behavioral Therapy  Diagnosis: Posttraumatic stress disorder  Plan: I will meet with the patient every week primarily  via care agility Progress: 30% Treatment plan: We will use cognitive behavioral therapy as well as elements of ego supportive therapy and dialectical behavior therapy with a goal of reducing the patient's anxiety and depression by at least 50% by November 27, 2023.  Goals for depression include the patient having less depression as evidenced by her report in session as well as PHQ-9 scores, to have improved mood and for her to return to a healthier level of functioning.  We will look to identify causes for depressed mood and learn coping skills for reduction of depression.  Interventions including using cognitive behavioral therapy to help her explore and replace unhealthy thoughts and behaviors contributing to depression, processed how she experiences depression daily, provide education about depression to help her identify its causes and symptoms as well as sharing feelings of depression.  We will teach and encouraged the use of coping skills for management of depression.  Goals for anxiety are to improve her ability to manage anxiety symptoms, panic symptoms and better handle stress.  We will identify causes for anxiety and panic and explore ways to reduce it as well as resolving and processing core conflicts that are contributing to anxiety and panic.  We will also work on managing worrisome thoughts contributing to feelings of anxiety and stress.  Interventions include providing education about anxiety to help her understand and identify its  causes and symptoms, facilitate problem solution skills for reducing stress and anxiety as well as teach coping skills to manage anxiety such as grounding exercises, progressive muscle relaxation etc.  We will also use cognitive behavioral therapy to identify and change anxiety producing thoughts and behaviors as well as teach dialectical behavior therapy distress tolerance and mindfulness skills to help her learn anxiety management skills.  Goals for reducing trauma responses for PTSD include helping the patient recall the trauma without feeling overwhelmed or consumed with negative emotion as well as helping her to return to a pre trauma level of functioning.  Interventions include helping the patient identify the traumatic events impact her daily life as well as process feelings associated with the trauma including guilt and shame and  sadness.  We will explore and as much detail as she is comfortable with sharing feelings regarding the trauma allowing for the gradual reduction of the emotional response through storytelling.  We will explore and reframe the patient's negative self talk associated with the trauma and identify negative self-defeating thoughts replacing it with positive thoughts and talk.  Lastly we will teach relaxation techniques to help her manage anxiety associated with the trauma.  French Ana, [facility]                         French Ana, [facility]               French Ana, [place]               French Ana, [facility]               French Ana, [facility]               French Ana, [facility]               French Ana, [facility]               French Ana, [facility]       French Ana, [facility]     French Ana, [facility]               French Ana, [facility]               French Ana, [facility]               French Ana,  [facility]               French Ana, [facility]                      French Ana, [facility]               French Ana, [facility]               French Ana, [facility]               French Ana, [facility]               French Ana, [facility]               French Ana, [facility]               French Ana, [facility]               French Ana, [facility]               French Ana, [facility]               French Ana, [facility]               French Ana, [facility]               French Ana, [facility]       French Ana, [facility]               French Ana, [facility]               French Ana, [facility]               French Ana, [facility]  French Ana, [facility]               French Ana, [facility]               French Ana, [facility]               French Ana, [facility]               French Ana, [facility]

## 2023-11-26 ENCOUNTER — Encounter: Payer: Self-pay | Admitting: Behavioral Health

## 2023-11-26 ENCOUNTER — Ambulatory Visit: Payer: BC Managed Care – PPO | Admitting: Behavioral Health

## 2023-11-26 DIAGNOSIS — F4310 Post-traumatic stress disorder, unspecified: Secondary | ICD-10-CM | POA: Diagnosis not present

## 2023-11-26 NOTE — Progress Notes (Signed)
[facility] Counselor/Therapist Progress Note  Patient ID: Tara Jones, MRN: [id number],    Date: 11/26/23  Time Spent.  3:02 PM until 4 PM, 58 minutes.  This session was held via video teletherapy. The patient consented to the video teletherapy and was located in her office during this session. She is aware it is the responsibility of the patient to secure confidentiality on her end of the session. The provider was in a private home office for the duration of this session.    The patient arrived on time for her Caregility session   Treatment Type: Individual Therapy  Reported Symptoms: Anxiety/stress, depression  Mental Status Exam: Appearance:  Casual and Well Groomed     Behavior: Appropriate  Motor: Normal  Speech/Language:  Normal Rate  Affect: Appropriate  Mood: normal  Thought process: normal  Thought content:   WNL  Sensory/Perceptual disturbances:   WNL  Orientation: oriented to person, place, time/date, situation, day of week, month of year, and year  Attention: Good  Concentration: Good  Memory: WNL  Fund of knowledge:  Good  Insight:   Good  Judgment:  Good  Impulse Control: Good   Risk Assessment: Danger to Self:  No Self-injurious Behavior: No Danger to Others: No Duty to Warn:no Physical Aggression / Violence:No  Access to Firearms a concern: No  Gang Involvement:No   Subjective: The patient is struggling with and is frustrated with all that has gone on the past few weeks with the current leadership in the country.  There are things about the decisions that are being made that her deeply troubling to her and overwhelming to the point where she has minimal motivation to do anything.  She indicates that even if she sleeps at night she wants to sleep during the day.  She estimates that she has only done a small amount of work over the past 3 to 4 days and she knows that is not good.  She feels that events taking place and decisions being  made give her no reason to be optimistic.  She does contract for safety but says she feels overwhelmed.  She is supposed to have some more time to speak to her boss tomorrow and has done very little of that.  I attempted to challenge some of the fears that she has but validated the fact that they based on what she has read are real.  I encouraged her to limit the amount of use in social media that she is taking in which she did do some of the last night.  We talked about compartmentalizing what work she has to do and to encouraging her to put at least 30 minutes to an hour and tonight and tomorrow before meeting with her boss.  We focused on the value that she brings to her company and the pleasure that she has gotten from this job.  I encouraged her not to project out what might happen based on decisions that are being made in this country.  She will be meeting with her psychiatrist tomorrow and I encouraged her to talk about possible medication evaluation/changes. Interventions: Cognitive Behavioral Therapy  Diagnosis: Posttraumatic stress disorder  Plan: I will meet with the patient every week primarily  via care agility Progress: 30% Treatment plan: We will use cognitive behavioral therapy as well as elements of ego supportive therapy and dialectical behavior therapy with a goal of reducing the patient's anxiety and depression by at least 50% by November 27, 2023.  Goals for depression include the patient having less depression as evidenced by her report in session as well as PHQ-9 scores, to have improved mood and for her to return to a healthier level of functioning.  We will look to identify causes for depressed mood and learn coping skills for reduction of depression.  Interventions including using cognitive behavioral therapy to help her explore and replace unhealthy thoughts and behaviors contributing to depression, processed how she experiences depression daily, provide education about depression  to help her identify its causes and symptoms as well as sharing feelings of depression.  We will teach and encouraged the use of coping skills for management of depression.  Goals for anxiety are to improve her ability to manage anxiety symptoms, panic symptoms and better handle stress.  We will identify causes for anxiety and panic and explore ways to reduce it as well as resolving and processing core conflicts that are contributing to anxiety and panic.  We will also work on managing worrisome thoughts contributing to feelings of anxiety and stress.  Interventions include providing education about anxiety to help her understand and identify its causes and symptoms, facilitate problem solution skills for reducing stress and anxiety as well as teach coping skills to manage anxiety such as grounding exercises, progressive muscle relaxation etc.  We will also use cognitive behavioral therapy to identify and change anxiety producing thoughts and behaviors as well as teach dialectical behavior therapy distress tolerance and mindfulness skills to help her learn anxiety management skills.  Goals for reducing trauma responses for PTSD include helping the patient recall the trauma without feeling overwhelmed or consumed with negative emotion as well as helping her to return to a pre trauma level of functioning.  Interventions include helping the patient identify the traumatic events impact her daily life as well as process feelings associated with the trauma including guilt and shame and sadness.  We will explore and as much detail as she is comfortable with sharing feelings regarding the trauma allowing for the gradual reduction of the emotional response through storytelling.  We will explore and reframe the patient's negative self talk associated with the trauma and identify negative self-defeating thoughts replacing it with positive thoughts and talk.  Lastly we will teach relaxation techniques to help her manage  anxiety associated with the trauma.  French Ana, [facility]                         French Ana, [facility]               French Ana, [facility]               French Ana, [facility]               French Ana, [facility]               French Ana, [facility]               French Ana, [facility]               French Ana, [facility]       French Ana, [facility]     French Ana, [facility]               French Ana, [facility]               French Ana, [facility]  French Ana, [facility]               French Ana, [facility]                      French Ana, [facility]               French Ana, [facility]               French Ana, [facility]               French Ana, [facility]               French Ana, [facility]               French Ana, [facility]               French Ana, [facility]               French Ana, [facility]               French Ana, [facility]               French Ana, [facility]               French Ana, [facility]               French Ana, [facility]       French Ana, [facility]               French Ana, [facility]               French Ana, [facility]               French Ana, [facility]               French Ana, [facility]               French Ana, [facility]               French Ana, [facility]  [facility] Counselor/Therapist Progress Note  Patient ID: Tara Jones, MRN: [id number],    Date: 11/19/23  Time Spent. 3pm to 3:58 pm 58 minutes.  This session was held via video teletherapy. The patient consented to the video teletherapy and was  located in her office during this session. She is aware it is the responsibility of the patient to secure confidentiality on her end of the session. The provider was in a private home office for the duration of this session.    The patient arrived on time for her Caregility session   Treatment Type: Individual Therapy  Reported Symptoms: Anxiety/stress, depression  Mental Status Exam: Appearance:  Casual and Well Groomed     Behavior: Appropriate  Motor: Normal  Speech/Language:  Normal Rate  Affect: Appropriate  Mood: normal  Thought process: normal  Thought content:   WNL  Sensory/Perceptual disturbances:   WNL  Orientation: oriented to person, place, time/date, situation, day of week, month of year, and year  Attention: Good  Concentration: Good  Memory: WNL  Fund of knowledge:  Good  Insight:   Good  Judgment:  Good  Impulse Control: Good   Risk Assessment: Danger to Self:  No Self-injurious Behavior: No Danger to Others: No Duty to Warn:no Physical Aggression / Violence:No  Access to Firearms a concern: No  Gang Involvement:No   Subjective: The  patient had a panic attack recently and knows that is connected to what is going on in the country and the change in administration.  She is bothered by a lot of the changes that are taking place and things that are being changed or introduced.  She deleted a lot of her new zaps so she would not become overwhelmed but information came in from different sources including other people and that is what led to her being overwhelmed.  She was able to reduce her anxiety and uses medication if she needs to be encouraged to limitation of exposure to those things which she knows are triggering for her and the importance of use of coping skills to help alleviate or minimize them.  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   It would extend beyond that to which she monitors it as a go to live.  She says right now work is a good distraction from all that is going on politically in the country. Interventions: Cognitive Behavioral Therapy  Diagnosis: Posttraumatic stress disorder  Plan: I will meet with the patient every week primarily  via care agility Progress: 30% Treatment plan: We will use cognitive behavioral therapy as well as elements of ego supportive therapy and dialectical behavior therapy with a goal of reducing the patient's anxiety and depression by at least 50% by November 27, 2023.  Goals for depression include the patient having less depression as evidenced by her report in session as well as PHQ-9 scores, to have improved mood and for her to return to a healthier level of functioning.  We will look to identify causes for depressed mood and learn coping skills for reduction of depression.  Interventions including using cognitive behavioral therapy to help her explore and replace unhealthy thoughts and behaviors contributing to depression, processed how she experiences depression daily, provide education about depression to help her identify its causes and symptoms as well as sharing feelings of depression.  We will teach and encouraged the use of coping skills for management of depression.  Goals for anxiety are to improve her ability to manage anxiety symptoms, panic symptoms and better handle stress.  We will identify causes for anxiety and panic and explore ways to reduce it as well as resolving and processing core conflicts that are contributing to anxiety and panic.  We will also work on managing worrisome thoughts contributing to feelings of anxiety and stress.  Interventions include providing education about anxiety to help her understand and identify its causes and symptoms, facilitate problem solution skills for reducing stress and anxiety as well as teach coping skills to manage anxiety such as grounding  exercises, progressive muscle relaxation etc.  We will also use cognitive behavioral therapy to identify and change anxiety producing thoughts and behaviors as well as teach dialectical behavior therapy distress tolerance and mindfulness skills to help her learn anxiety management skills.  Goals for reducing trauma responses for PTSD include helping the patient recall the trauma without feeling overwhelmed or consumed with negative emotion as well as helping her to return to a pre trauma level of functioning.  Interventions include helping the patient identify the traumatic events impact her daily life as well as process feelings associated with the trauma including guilt and shame and sadness.  We will explore and as much detail as she is comfortable with sharing feelings regarding the  trauma allowing for the gradual reduction of the emotional response through storytelling.  We will explore and reframe the patient's negative self talk associated with the trauma and identify negative self-defeating thoughts replacing it with positive thoughts and talk.  Lastly we will teach relaxation techniques to help her manage anxiety associated with the trauma.  French Ana, [facility]                         French Ana, [facility]               French Ana, [facility]               French Ana, [facility]               French Ana, [facility]               French Ana, [facility]               French Ana, [facility]               French Ana, [facility]       French Ana, [facility]     French Ana, [facility]               French Ana, [facility]               French Ana, [facility]               French Ana, [facility]               French Ana, [facility]                      French Ana, [facility]               French Ana, [facility]               French Ana, [facility]               French Ana, [facility]               French Ana, [facility]               French Ana, [facility]               French Ana, [facility]               French Ana, [facility]               French Ana, [facility]               French Ana, [facility]               French Ana, [facility]               French Ana, [facility]       French Ana, [facility]               French Ana, [facility]               French Ana, [facility]               French Ana, [facility]               French Ana, [facility]  French Ana, [facility]               French Ana, [facility]               French Ana, [facility]               French Ana, [facility]               French Ana, [facility]

## 2023-11-27 DIAGNOSIS — F411 Generalized anxiety disorder: Secondary | ICD-10-CM | POA: Diagnosis not present

## 2023-11-27 DIAGNOSIS — F330 Major depressive disorder, recurrent, mild: Secondary | ICD-10-CM | POA: Diagnosis not present

## 2023-11-27 DIAGNOSIS — F900 Attention-deficit hyperactivity disorder, predominantly inattentive type: Secondary | ICD-10-CM | POA: Diagnosis not present

## 2023-11-27 DIAGNOSIS — F5105 Insomnia due to other mental disorder: Secondary | ICD-10-CM | POA: Diagnosis not present

## 2023-12-03 ENCOUNTER — Encounter: Payer: Self-pay | Admitting: Behavioral Health

## 2023-12-03 ENCOUNTER — Ambulatory Visit: Payer: BC Managed Care – PPO | Admitting: Behavioral Health

## 2023-12-03 DIAGNOSIS — F4310 Post-traumatic stress disorder, unspecified: Secondary | ICD-10-CM

## 2023-12-04 NOTE — Progress Notes (Addendum)
 [facility] Counselor/Therapist Progress Note  Patient ID: Tara Jones, MRN: [id number],    Date:12/03/23  Time Spent.  4:00PM until 5PM, 60 minutes.  This session was held via video teletherapy. The patient consented to the video teletherapy and was located in her office during this session. She is aware it is the responsibility of the patient to secure confidentiality on her end of the session. The provider was in a private home office for the duration of this session.    The patient arrived on time for her Caregility session   Treatment Type: Individual Therapy  Reported Symptoms: Anxiety/stress, depression  Mental Status Exam: Appearance:  Casual and Well Groomed     Behavior: Appropriate  Motor: Normal  Speech/Language:  Normal Rate  Affect: Appropriate  Mood: normal  Thought process: normal  Thought content:   WNL  Sensory/Perceptual disturbances:   WNL  Orientation: oriented to person, place, time/date, situation, day of week, month of year, and year  Attention: Good  Concentration: Good  Memory: WNL  Fund of knowledge:  Good  Insight:   Good  Judgment:  Good  Impulse Control: Good   Risk Assessment: Danger to Self:  No Self-injurious Behavior: No Danger to Others: No Duty to Warn:no Physical Aggression / Violence:No  Access to Firearms a concern: No  Gang Involvement:No   Subjective: There has been some improvement in the patient's mood over the past week.  For the most part she has not immersed herself in what is going on in the world.  There is still significant discouragement and frustration with things are taking place especially in [place]  but she has tried to separate herself from that.  She is still trying to find what the middle ground is because she knows she cannot or does not want to avoid what is going on a long-term basis.  She was able to get back to work after our last session and do a little bit but says she still behind  because she worked so little last week.  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  She estimates that many podcast will be about 5 minutes in the podcast when she has enough scripts written will be about an hour.  She is going to have 3 of her friends share that time with her and they are all in agreement to that.  It is not political or sports in nature and will have various topics 1 and specifically she has set up to start with.  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  I encouraged her to continue to find that balance are what she calls medium between being knowledgeable of what is going on in the world and not allowing it to overwhelm her.  Interventions: Cognitive Behavioral Therapy  Diagnosis: Posttraumatic stress disorder  Plan: I will meet with the patient every week primarily  via care agility Progress: 35% Treatment plan: We will use cognitive behavioral therapy as well as elements of ego supportive therapy and dialectical  behavior therapy with a goal of reducing the patient's anxiety and depression by at least 50% by November 27, 2023.  Goals for depression include the patient having less depression as evidenced by her report in session as well as PHQ-9 scores, to have improved mood and for her to return to a healthier level of functioning.  We will look to identify causes for depressed mood and learn coping skills for reduction of depression.  Interventions including using cognitive behavioral therapy to help her explore and replace unhealthy thoughts and behaviors  contributing to depression, processed how she experiences depression daily, provide education about depression to help her identify its causes and symptoms as well as sharing feelings of depression.  We will teach and encouraged the use of coping skills for management of depression.  Goals for anxiety are to improve her ability to manage anxiety symptoms, panic symptoms and better handle stress.  We will identify causes for anxiety and panic and explore ways to reduce it as well as resolving and processing core conflicts that are contributing to anxiety and panic.  We will also work on managing worrisome thoughts contributing to feelings of anxiety and stress.  Interventions include providing education about anxiety to help her understand and identify its causes and symptoms, facilitate problem solution skills for reducing stress and anxiety as well as teach coping skills to manage anxiety such as grounding exercises, progressive muscle relaxation etc.  We will also use cognitive behavioral therapy to identify and change anxiety producing thoughts and behaviors as well as teach dialectical behavior therapy distress tolerance and mindfulness skills to help her learn anxiety management skills.  Goals for reducing trauma responses for PTSD include helping the patient recall the trauma without feeling overwhelmed or consumed with negative emotion as well as helping her to return to a pre trauma level of functioning.  Interventions include helping the patient identify the traumatic events impact her daily life as well as process feelings associated with the trauma including guilt and shame and sadness.  We will explore and as much detail as she is comfortable with sharing feelings regarding the trauma allowing for the gradual reduction of the emotional response through storytelling.  We will explore and reframe the patient's negative self talk associated with the trauma and identify negative self-defeating thoughts  replacing it with positive thoughts and talk.  Lastly we will teach relaxation techniques to help her manage anxiety associated with the trauma.  Reviewed treatment goals and extended the goals with a new target date of May 25, 2024  Lorrene CHRISTELLA Hasten, [facility]                         Lorrene CHRISTELLA Hasten, [facility]               Lorrene CHRISTELLA Hasten, [facility]               Lorrene CHRISTELLA Hasten, [facility]               Lorrene CHRISTELLA Hasten, [facility]               Lorrene CHRISTELLA Hasten, [facility]               Lorrene CHRISTELLA Hasten, [facility]               Lorrene CHRISTELLA Hasten, [facility]       Lorrene CHRISTELLA Hasten, [facility]     Lorrene CHRISTELLA Hasten, [facility]  Lorrene CHRISTELLA Hasten, [facility]               Lorrene CHRISTELLA Hasten, [facility]               Lorrene CHRISTELLA Hasten, [facility]               Lorrene CHRISTELLA Hasten, [facility]                      Lorrene CHRISTELLA Hasten, [facility]               Lorrene CHRISTELLA Hasten, [facility]               Lorrene CHRISTELLA Hasten, [facility]               Lorrene CHRISTELLA Hasten, [facility]               Lorrene CHRISTELLA Hasten, [facility]               Lorrene CHRISTELLA Hasten, [facility]               Lorrene CHRISTELLA Hasten, [facility]               Lorrene CHRISTELLA Hasten, [facility]               Lorrene CHRISTELLA Hasten, [facility]               Lorrene CHRISTELLA Hasten, [facility]               Lorrene CHRISTELLA Hasten, [facility]               Lorrene CHRISTELLA Hasten, [facility]       Lorrene CHRISTELLA Hasten, [facility]               Lorrene CHRISTELLA Hasten, [facility]               Lorrene CHRISTELLA Hasten, [facility]               Lorrene CHRISTELLA Hasten, [facility]               Lorrene CHRISTELLA Hasten, [facility]               Lorrene CHRISTELLA Hasten, [facility]               Lorrene CHRISTELLA Hasten, [facility]  [facility] Counselor/Therapist Progress Note  Patient  ID: Tara Jones, MRN: [id number],    Date: 11/19/23  Time Spent. 3pm to 3:58 pm 58 minutes.  This session was held via video teletherapy. The patient consented to the video teletherapy and was located in her office during this session. She is aware it is the responsibility of the patient to secure confidentiality on her end of the session. The provider was in a private home office for the duration of this session.    The patient arrived on time for her Caregility session   Treatment Type: Individual Therapy  Reported Symptoms: Anxiety/stress, depression  Mental Status Exam: Appearance:  Casual and Well Groomed     Behavior: Appropriate  Motor: Normal  Speech/Language:  Normal Rate  Affect: Appropriate  Mood: normal  Thought process: normal  Thought content:   WNL  Sensory/Perceptual disturbances:   WNL  Orientation: oriented to person, place, time/date, situation, day of week, month of year, and year  Attention: Good  Concentration: Good  Memory: WNL  Fund of knowledge:  Good  Insight:   Good  Judgment:  Good  Impulse Control: Good  Risk Assessment: Danger to Self:  No Self-injurious Behavior: No Danger to Others: No Duty to Warn:no Physical Aggression / Violence:No  Access to Firearms a concern: No  Gang Involvement:No   Subjective: The patient had a panic attack recently and knows that is connected to what is going on in the country and the change in administration.  She is bothered by a lot of the changes that are taking place and things that are being changed or introduced.  She deleted a lot of her new zaps so she would not become overwhelmed but information came in from different sources including other people and that is what led to her being overwhelmed.  She was able to reduce her anxiety and uses medication if she needs to be encouraged to limitation of exposure to those things which she knows are triggering for her and the importance of use of coping skills to  help alleviate or minimize them.  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  It would extend beyond that to which she monitors it as a go to live.  She says right now work is a good distraction from all that is going on politically in the country. Interventions: Cognitive Behavioral Therapy  Diagnosis: Posttraumatic stress disorder  Plan: I will meet with the patient every week primarily  via care agility Progress: 30% Treatment plan: We will use cognitive behavioral therapy as well as elements of ego supportive therapy and dialectical behavior therapy with a goal of reducing the patient's anxiety and depression by at least 50% by November 27, 2023.  Goals for depression include the patient having less depression as evidenced by her report in session as well as PHQ-9 scores, to have improved mood and for her to return to a healthier level of functioning.  We will look to identify causes for depressed mood and learn coping skills for reduction of depression.  Interventions including using cognitive behavioral therapy to help her explore and replace unhealthy thoughts and behaviors contributing to depression, processed how she experiences depression daily, provide education about depression to help her identify its causes and symptoms as well as sharing feelings of depression.  We will teach and encouraged the use of coping skills for management of depression.  Goals for anxiety are to improve her ability to manage anxiety symptoms, panic symptoms and better handle stress.  We will identify causes for anxiety and panic and explore ways to reduce it as well as resolving and processing core conflicts that are contributing to anxiety and panic.  We will also work on managing worrisome thoughts contributing to feelings of anxiety and stress.  Interventions include providing education about  anxiety to help her understand and identify its causes and symptoms, facilitate problem solution skills for reducing stress and anxiety as well as teach coping skills to manage anxiety such as grounding exercises, progressive muscle relaxation etc.  We will also use cognitive behavioral therapy to identify and change anxiety producing thoughts and behaviors as well as teach dialectical behavior therapy distress tolerance and mindfulness skills to help her learn anxiety management skills.  Goals for reducing trauma responses for PTSD include helping the patient recall the trauma without feeling overwhelmed or consumed with negative emotion as well as helping her to return to a pre trauma level of functioning.  Interventions include helping the patient identify the traumatic events impact  her daily life as well as process feelings associated with the trauma including guilt and shame and sadness.  We will explore and as much detail as she is comfortable with sharing feelings regarding the trauma allowing for the gradual reduction of the emotional response through storytelling.  We will explore and reframe the patient's negative self talk associated with the trauma and identify negative self-defeating thoughts replacing it with positive thoughts and talk.  Lastly we will teach relaxation techniques to help her manage anxiety associated with the trauma.  Lorrene CHRISTELLA Hasten, [facility]                         Lorrene CHRISTELLA Hasten, [facility]               Lorrene CHRISTELLA Hasten, [facility]               Lorrene CHRISTELLA Hasten, [facility]               Lorrene CHRISTELLA Hasten, [facility]               Lorrene CHRISTELLA Hasten, [facility]               Lorrene CHRISTELLA Hasten, [facility]               Lorrene CHRISTELLA Hasten, [facility]       Lorrene CHRISTELLA Hasten, [facility]     Lorrene CHRISTELLA Hasten, [facility]               Lorrene CHRISTELLA Hasten, [facility]               Lorrene CHRISTELLA Hasten,  [facility]               Lorrene CHRISTELLA Hasten, [facility]               Lorrene CHRISTELLA Hasten, [facility]                      Lorrene CHRISTELLA Hasten, [facility]               Lorrene CHRISTELLA Hasten, [facility]               Lorrene CHRISTELLA Hasten, [facility]               Lorrene CHRISTELLA Hasten, [facility]               Lorrene CHRISTELLA Hasten, [facility]               Lorrene CHRISTELLA Hasten, [facility]               Lorrene CHRISTELLA Hasten, [facility]               Lorrene CHRISTELLA Hasten, [facility]               Lorrene CHRISTELLA Hasten, [facility]               Lorrene CHRISTELLA Hasten, [facility]               Lorrene CHRISTELLA Hasten, [facility]               Lorrene CHRISTELLA Hasten, [facility]       Lorrene CHRISTELLA Hasten, [facility]               Lorrene CHRISTELLA Hasten, [facility]               Lorrene CHRISTELLA Hasten, [facility]  Lorrene CHRISTELLA Hasten, [facility]               Lorrene CHRISTELLA Hasten, [facility]               Lorrene CHRISTELLA Hasten, [facility]               Lorrene CHRISTELLA Hasten, [facility]               Lorrene CHRISTELLA Hasten, [facility]               Lorrene CHRISTELLA Hasten, [facility]               Lorrene CHRISTELLA Hasten, [facility]               Lorrene CHRISTELLA Hasten, [facility]

## 2023-12-08 ENCOUNTER — Encounter: Payer: Self-pay | Admitting: Behavioral Health

## 2023-12-08 ENCOUNTER — Ambulatory Visit: Payer: BC Managed Care – PPO | Admitting: Behavioral Health

## 2023-12-08 DIAGNOSIS — F4310 Post-traumatic stress disorder, unspecified: Secondary | ICD-10-CM

## 2023-12-08 NOTE — Progress Notes (Signed)
 [facility] Counselor/Therapist Progress Note  Patient ID: Tara Jones, MRN: [id number],    Date:12/08/23  Time Spent.  2 PM until to 2:58 PM, 58 minutes spent in person in the outpatient therapist office.     Treatment Type: Individual Therapy  Reported Symptoms: Anxiety/stress, depression  Mental Status Exam: Appearance:  Casual and Well Groomed     Behavior: Appropriate  Motor: Normal  Speech/Language:  Normal Rate  Affect: Appropriate  Mood: normal  Thought process: normal  Thought content:   WNL  Sensory/Perceptual disturbances:   WNL  Orientation: oriented to person, place, time/date, situation, day of week, month of year, and year  Attention: Good  Concentration: Good  Memory: WNL  Fund of knowledge:  Good  Insight:   Good  Judgment:  Good  Impulse Control: Good   Risk Assessment: Danger to Self:  No Self-injurious Behavior: No Danger to Others: No Duty to Warn:no Physical Aggression / Violence:No  Access to Firearms a concern: No  Gang Involvement:No   Subjective: The patient continues to feel somewhat blah.".  She is not looking to see what is going on in the [place] in terms of the new administration.    She is not having those kind of conversations with her friends or coworkers.  She is getting her work done but has very little motivation to do that right now.  She has made some improvement in that she is not laying in bed most of the morning and she is getting up and trying to be productive.  She describes her thoughts primarily as neutral saying even with her ADD brain she can distract herself by listening to podcast, searching on line, smoking.  She says that she is still smoking her car which she cannot do but that is part of how she is coping right now.  I encouraged exercise that she says she is taking her dog motivation to do that either but will try to do so as the weather gets nicer.  She is going to the grocery store medication  and seeing her parents on occasion but is setting good boundaries there.  She is having minimal interaction with her boss but knows that she is doing enough that he is not questioning her work.  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  She has done to sit for her for the most Part Very positive comments.  She has 3 more ready.  I encouraged to continue that and she does see that is beneficial and therapeutic.  I encouraged her to continue to find that balance are what she calls "medium" between being knowledgeable of what is going on in the world and not allowing it to overwhelm her.  Interventions: Cognitive Behavioral Therapy  Diagnosis: Posttraumatic stress disorder  Plan: I will meet with the patient every week primarily  via care agility Progress: 35% Treatment plan: We will use cognitive behavioral therapy as well as elements of ego supportive therapy and dialectical behavior therapy with a goal of reducing the patient's anxiety and depression by at least 50% by November 27, 2023.  Goals for depression include the patient having less depression as evidenced by her report in session as well as PHQ-9 scores, to have improved mood and for her to return to a healthier level of functioning.  We will look to identify causes for depressed mood and learn coping skills for reduction of depression.  Interventions including using cognitive behavioral therapy to help her explore  and replace unhealthy thoughts and behaviors contributing to depression, processed how she experiences depression daily, provide education about depression to help her identify its causes and symptoms as well as sharing feelings of depression.  We will teach and encouraged the use of coping skills for management of depression.  Goals for anxiety are to improve her ability to manage anxiety symptoms, panic symptoms and better handle stress.  We will identify causes for anxiety and panic and explore ways to reduce it as well as  resolving and processing core conflicts that are contributing to anxiety and panic.  We will also work on managing worrisome thoughts contributing to feelings of anxiety and stress.  Interventions include providing education about anxiety to help her understand and identify its causes and symptoms, facilitate problem solution skills for reducing stress and anxiety as well as teach coping skills to manage anxiety such as grounding exercises, progressive muscle relaxation etc.  We will also use cognitive behavioral therapy to identify and change anxiety producing thoughts and behaviors as well as teach dialectical behavior therapy distress tolerance and mindfulness skills to help her learn anxiety management skills.  Goals for reducing trauma responses for PTSD include helping the patient recall the trauma without feeling overwhelmed or consumed with negative emotion as well as helping her to return to a pre trauma level of functioning.  Interventions include helping the patient identify the traumatic events impact her daily life as well as process feelings associated with the trauma including guilt and shame and sadness.  We will explore and as much detail as she is comfortable with sharing feelings regarding the trauma allowing for the gradual reduction of the emotional response through storytelling.  We will explore and reframe the patient's negative self talk associated with the trauma and identify negative self-defeating thoughts replacing it with positive thoughts and talk.  Lastly we will teach relaxation techniques to help her manage anxiety associated with the trauma.  Reviewed treatment goals and extended the goals with a new target date of May 25, 2024  French Ana, [facility]                         French Ana, [facility]               French Ana, [facility]               French Ana, [facility]               French Ana,  [facility]               French Ana, [facility]               French Ana, [facility]               French Ana, [facility]       French Ana, [facility]     French Ana, [facility]               French Ana, [facility]               French Ana, [facility]               French Ana, [facility]               French Ana, [facility]  French Ana, [facility]               French Ana, [facility]               French Ana, [facility]               French Ana, [facility]               French Ana, [facility]               French Ana, [facility]               French Ana, [facility]               French Ana, [facility]               French Ana, [facility]               French Ana, [facility]               French Ana, [facility]               French Ana, [facility]       French Ana, [facility]               French Ana, [facility]               French Ana, [facility]               French Ana, [facility]               French Ana, [facility]               French Ana, [facility]               French Ana, [facility]  [facility] Counselor/Therapist Progress Note  Patient ID: Tara Jones, MRN: [id number],    Date: 11/19/23  Time Spent. 3pm to 3:58 pm 58 minutes.  This session was held via video teletherapy. The patient consented to the video teletherapy and was located in her office during this session. She is aware it is the responsibility of the patient to secure confidentiality on her end of the session. The provider was in a private home office for the duration of this session.    The patient arrived on time for her Caregility  session   Treatment Type: Individual Therapy  Reported Symptoms: Anxiety/stress, depression  Mental Status Exam: Appearance:  Casual and Well Groomed     Behavior: Appropriate  Motor: Normal  Speech/Language:  Normal Rate  Affect: Appropriate  Mood: normal  Thought process: normal  Thought content:   WNL  Sensory/Perceptual disturbances:   WNL  Orientation: oriented to person, place, time/date, situation, day of week, month of year, and year  Attention: Good  Concentration: Good  Memory: WNL  Fund of knowledge:  Good  Insight:   Good  Judgment:  Good  Impulse Control: Good   Risk Assessment: Danger to Self:  No Self-injurious Behavior: No Danger to Others: No Duty to Warn:no Physical Aggression / Violence:No  Access to Firearms a concern: No  Gang Involvement:No   Subjective: The patient had a panic attack recently and knows that is connected to what is going on in the country and the change in administration.  She is bothered by a lot of the changes that are taking place and things that are being  changed or introduced.  She deleted a lot of her new zaps so she would not become overwhelmed but information came in from different sources including other people and that is what led to her being overwhelmed.  She was able to reduce her anxiety and uses medication if she needs to be encouraged to limitation of exposure to those things which she knows are triggering for her and the importance of use of coping skills to help alleviate or minimize them.  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  It would extend beyond that to which she monitors it as a go to live.  She says right now work is a good distraction from all that is going on politically in the country. Interventions: Cognitive Behavioral Therapy  Diagnosis: Posttraumatic stress disorder  Plan: I will  meet with the patient every week primarily  via care agility Progress: 30% Treatment plan: We will use cognitive behavioral therapy as well as elements of ego supportive therapy and dialectical behavior therapy with a goal of reducing the patient's anxiety and depression by at least 50% by November 27, 2023.  Goals for depression include the patient having less depression as evidenced by her report in session as well as PHQ-9 scores, to have improved mood and for her to return to a healthier level of functioning.  We will look to identify causes for depressed mood and learn coping skills for reduction of depression.  Interventions including using cognitive behavioral therapy to help her explore and replace unhealthy thoughts and behaviors contributing to depression, processed how she experiences depression daily, provide education about depression to help her identify its causes and symptoms as well as sharing feelings of depression.  We will teach and encouraged the use of coping skills for management of depression.  Goals for anxiety are to improve her ability to manage anxiety symptoms, panic symptoms and better handle stress.  We will identify causes for anxiety and panic and explore ways to reduce it as well as resolving and processing core conflicts that are contributing to anxiety and panic.  We will also work on managing worrisome thoughts contributing to feelings of anxiety and stress.  Interventions include providing education about anxiety to help her understand and identify its causes and symptoms, facilitate problem solution skills for reducing stress and anxiety as well as teach coping skills to manage anxiety such as grounding exercises, progressive muscle relaxation etc.  We will also use cognitive behavioral therapy to identify and change anxiety producing thoughts and behaviors as well as teach dialectical behavior therapy distress tolerance and mindfulness skills to help her learn anxiety  management skills.  Goals for reducing trauma responses for PTSD include helping the patient recall the trauma without feeling overwhelmed or consumed with negative emotion as well as helping her to return to a pre trauma level of functioning.  Interventions include helping the patient identify the traumatic events impact her daily life as well as process feelings associated with the trauma including guilt and shame and sadness.  We will explore and as much detail as she is comfortable with sharing feelings regarding the trauma allowing for the gradual reduction of the emotional response through storytelling.  We will explore and reframe the patient's negative self talk associated with the trauma and identify negative self-defeating thoughts replacing it with positive thoughts and talk.  Lastly we will  teach relaxation techniques to help her manage anxiety associated with the trauma.  French Ana, [facility]                         French Ana, [facility]               French Ana, [facility]               French Ana, [facility]               French Ana, [facility]               French Ana, [facility]               French Ana, [facility]               French Ana, [facility]       French Ana, [facility]     French Ana, [facility]               French Ana, [facility]               French Ana, [facility]               French Ana, [facility]               French Ana, [facility]                      French Ana, [facility]               French Ana, [facility]               French Ana, [facility]               French Ana, [facility]               French Ana, [facility]               French Ana, [facility]               French Ana,  [facility]               French Ana, [facility]               French Ana, [facility]               French Ana, [facility]               French Ana, [facility]               French Ana, [facility]       French Ana, [facility]               French Ana, [facility]               French Ana, [facility]               French Ana, [facility]               French Ana, [facility]               French Ana, [facility]               French Ana, [facility]               Tasia Catchings  Antonieta Loveless, [facility]               French Ana, [facility]               French Ana, [facility]               French Ana, [facility]               French Ana, [facility]

## 2023-12-17 ENCOUNTER — Encounter: Payer: Self-pay | Admitting: Behavioral Health

## 2023-12-17 ENCOUNTER — Ambulatory Visit: Payer: BC Managed Care – PPO | Admitting: Behavioral Health

## 2023-12-17 DIAGNOSIS — F4310 Post-traumatic stress disorder, unspecified: Secondary | ICD-10-CM | POA: Diagnosis not present

## 2023-12-17 NOTE — Progress Notes (Signed)
 [facility] Counselor/Therapist Progress Note  Patient ID: BRENT NOTO, MRN: [id number],    Date:12/17/23  Time Spent.  4 PM until 4:55 PM, 55 minutes.This session was held via video teletherapy. The patient consented to the video teletherapy and was located in her office during this session. She is aware it is the responsibility of the patient to secure confidentiality on her end of the session. The provider was in a private home office for the duration of this session.    Treatment Type: Individual Therapy  Reported Symptoms: Anxiety/stress, depression  Mental Status Exam: Appearance:  Casual and Well Groomed     Behavior: Appropriate  Motor: Normal  Speech/Language:  Normal Rate  Affect: Appropriate  Mood: normal  Thought process: normal  Thought content:   WNL  Sensory/Perceptual disturbances:   WNL  Orientation: oriented to person, place, time/date, situation, day of week, month of year, and year  Attention: Good  Concentration: Good  Memory: WNL  Fund of knowledge:  Good  Insight:   Good  Judgment:  Good  Impulse Control: Good   Risk Assessment: Danger to Self:  No Self-injurious Behavior: No Danger to Others: No Duty to Warn:no Physical Aggression / Violence:No  Access to Firearms a concern: No  Gang Involvement:No   Subjective: The patient presented with brighter affect today than in the past 2 or 3 sessions.  She still limiting how much of what is going on in the country politically but does still get some information.  She does not like what is taking place but is not allowing it to add to her depression.  We did discuss the situation during one of her a lot of stream of events and how she handled that situation.  We also looked at where she is in terms of work and motivation.  There were several other events made this week before this week fairly stressful.  She reached out to her mom who was supportive at least in the situations.  This week  has been better as she has set some clear boundaries. I encouraged her to continue to find that balance are what she calls "medium" between being knowledgeable of what is going on in the world and not allowing it to overwhelm her.  Interventions: Cognitive Behavioral Therapy  Diagnosis: Posttraumatic stress disorder  Plan: I will meet with the patient every week primarily  via care agility Progress: 35% Treatment plan: We will use cognitive behavioral therapy as well as elements of ego supportive therapy and dialectical behavior therapy with a goal of reducing the patient's anxiety and depression by at least 50% by November 27, 2023.  Goals for depression include the patient having less depression as evidenced by her report in session as well as PHQ-9 scores, to have improved mood and for her to return to a healthier level of functioning.  We will look to identify causes for depressed mood and learn coping skills for reduction of depression.  Interventions including using cognitive behavioral therapy to help her explore and replace unhealthy thoughts and behaviors contributing to depression, processed how she experiences depression daily, provide education about depression to help her identify its causes and symptoms as well as sharing feelings of depression.  We will teach and encouraged the use of coping skills for management of depression.  Goals for anxiety are to improve her ability to manage anxiety symptoms, panic symptoms and better handle stress.  We will identify causes for anxiety and panic and explore ways  to reduce it as well as resolving and processing core conflicts that are contributing to anxiety and panic.  We will also work on managing worrisome thoughts contributing to feelings of anxiety and stress.  Interventions include providing education about anxiety to help her understand and identify its causes and symptoms, facilitate problem solution skills for reducing stress and anxiety as  well as teach coping skills to manage anxiety such as grounding exercises, progressive muscle relaxation etc.  We will also use cognitive behavioral therapy to identify and change anxiety producing thoughts and behaviors as well as teach dialectical behavior therapy distress tolerance and mindfulness skills to help her learn anxiety management skills.  Goals for reducing trauma responses for PTSD include helping the patient recall the trauma without feeling overwhelmed or consumed with negative emotion as well as helping her to return to a pre trauma level of functioning.  Interventions include helping the patient identify the traumatic events impact her daily life as well as process feelings associated with the trauma including guilt and shame and sadness.  We will explore and as much detail as she is comfortable with sharing feelings regarding the trauma allowing for the gradual reduction of the emotional response through storytelling.  We will explore and reframe the patient's negative self talk associated with the trauma and identify negative self-defeating thoughts replacing it with positive thoughts and talk.  Lastly we will teach relaxation techniques to help her manage anxiety associated with the trauma.  Reviewed treatment goals and extended the goals with a new target date of May 25, 2024  French Ana, [facility]

## 2024-01-11 ENCOUNTER — Encounter: Payer: Self-pay | Admitting: Behavioral Health

## 2024-01-12 ENCOUNTER — Ambulatory Visit: Admitting: Behavioral Health

## 2024-01-12 ENCOUNTER — Encounter: Payer: Self-pay | Admitting: Behavioral Health

## 2024-01-12 DIAGNOSIS — F4310 Post-traumatic stress disorder, unspecified: Secondary | ICD-10-CM

## 2024-01-12 NOTE — Progress Notes (Signed)
 [facility] Counselor/Therapist Progress Note  Patient ID: RAKEB KIBBLE, MRN: [id number],    Date:12/1823  Time Spent.  10:10 AM until 11:00 AM, 50 minutes.This session was held via video teletherapy. The patient consented to the video teletherapy and was located in her office during this session. She is aware it is the responsibility of the patient to secure confidentiality on her end of the session. The provider was in a private home office for the duration of this session.    Treatment Type: Individual Therapy  Reported Symptoms: Anxiety/stress, depression  Mental Status Exam: Appearance:  Casual and Well Groomed     Behavior: Appropriate  Motor: Normal  Speech/Language:  Normal Rate  Affect: Appropriate  Mood: normal  Thought process: normal  Thought content:   WNL  Sensory/Perceptual disturbances:   WNL  Orientation: oriented to person, place, time/date, situation, day of week, month of year, and year  Attention: Good  Concentration: Good  Memory: WNL  Fund of knowledge:  Good  Insight:   Good  Judgment:  Good  Impulse Control: Good   Risk Assessment: Danger to Self:  No Self-injurious Behavior: No Danger to Others: No Duty to Warn:no Physical Aggression / Violence:No  Access to Firearms a concern: No  Gang Involvement:No   Subjective: The patient is grieving.  Approximately 2 weeks ago she came home to find her dog distended.  It was initially incorrectly diagnosed as a gastrointestinal issue but as he started exploring it more they found out that her dog had cancer starting in the prostrating moving to the spleen or vice versa but either way the diagnosis is not very good.  They are meeting with the oncologist on March 24.  Along with diagnoses there is a possibility that there could be an immediate rupture of the tumor in which the dog died quickly.  She loves her dog but this is also the dog that saved her life so the grief is complicated.  We  talked about how she is coping with that now and what could be helpful to her as well as starting to prepare for changes that may take place.  I encouraged her not to make any major decisions.  Thankfully her boss is very understanding.  2 of her friends are checking in on her constantly and her parents have been some help.  Encouraged her to reach out to those people and allow them to help her as much as they can.  I will put her on the cancellation list to see her next week after Monday if possible.  Interventions: Cognitive Behavioral Therapy  Diagnosis: Posttraumatic stress disorder  Plan: I will meet with the patient every week primarily  via care agility Progress: 35% Treatment plan: We will use cognitive behavioral therapy as well as elements of ego supportive therapy and dialectical behavior therapy with a goal of reducing the patient's anxiety and depression by at least 50% by November 27, 2023.  Goals for depression include the patient having less depression as evidenced by her report in session as well as PHQ-9 scores, to have improved mood and for her to return to a healthier level of functioning.  We will look to identify causes for depressed mood and learn coping skills for reduction of depression.  Interventions including using cognitive behavioral therapy to help her explore and replace unhealthy thoughts and behaviors contributing to depression, processed how she experiences depression daily, provide education about depression to help her identify its causes and  symptoms as well as sharing feelings of depression.  We will teach and encouraged the use of coping skills for management of depression.  Goals for anxiety are to improve her ability to manage anxiety symptoms, panic symptoms and better handle stress.  We will identify causes for anxiety and panic and explore ways to reduce it as well as resolving and processing core conflicts that are contributing to anxiety and panic.  We will  also work on managing worrisome thoughts contributing to feelings of anxiety and stress.  Interventions include providing education about anxiety to help her understand and identify its causes and symptoms, facilitate problem solution skills for reducing stress and anxiety as well as teach coping skills to manage anxiety such as grounding exercises, progressive muscle relaxation etc.  We will also use cognitive behavioral therapy to identify and change anxiety producing thoughts and behaviors as well as teach dialectical behavior therapy distress tolerance and mindfulness skills to help her learn anxiety management skills.  Goals for reducing trauma responses for PTSD include helping the patient recall the trauma without feeling overwhelmed or consumed with negative emotion as well as helping her to return to a pre trauma level of functioning.  Interventions include helping the patient identify the traumatic events impact her daily life as well as process feelings associated with the trauma including guilt and shame and sadness.  We will explore and as much detail as she is comfortable with sharing feelings regarding the trauma allowing for the gradual reduction of the emotional response through storytelling.  We will explore and reframe the patient's negative self talk associated with the trauma and identify negative self-defeating thoughts replacing it with positive thoughts and talk.  Lastly we will teach relaxation techniques to help her manage anxiety associated with the trauma.  Reviewed treatment goals and extended the goals with a new target date of May 25, 2024  French Ana, [facility]                                  French Ana, [facility]

## 2024-01-28 ENCOUNTER — Encounter: Payer: Self-pay | Admitting: Behavioral Health

## 2024-01-28 ENCOUNTER — Ambulatory Visit: Admitting: Behavioral Health

## 2024-01-28 DIAGNOSIS — F4310 Post-traumatic stress disorder, unspecified: Secondary | ICD-10-CM | POA: Diagnosis not present

## 2024-01-28 NOTE — Progress Notes (Signed)
 [facility] Counselor/Therapist Progress Note  Patient ID: JOLEEN STUCKERT, MRN: [id number],    Date:01/28/24  Time Spent.  8:02 AM until 9 AM, 58 minutes.This session was held via video teletherapy. The patient consented to the video teletherapy and was located in her office during this session. She is aware it is the responsibility of the patient to secure confidentiality on her end of the session. The provider was in a private home office for the duration of this session.    Treatment Type: Individual Therapy  Reported Symptoms: Anxiety/stress, depression  Mental Status Exam: Appearance:  Casual and Well Groomed     Behavior: Appropriate  Motor: Normal  Speech/Language:  Normal Rate  Affect: Appropriate  Mood: normal  Thought process: normal  Thought content:   WNL  Sensory/Perceptual disturbances:   WNL  Orientation: oriented to person, place, time/date, situation, day of week, month of year, and year  Attention: Good  Concentration: Good  Memory: WNL  Fund of knowledge:  Good  Insight:   Good  Judgment:  Good  Impulse Control: Good   Risk Assessment: Danger to Self:  No Self-injurious Behavior: No Danger to Others: No Duty to Warn:no Physical Aggression / Violence:No  Access to Firearms a concern: No  Gang Involvement:No   Subjective: The patient did find out that her dog has cancer but it is completely different than what most of the doctors thought initially.  He does not have tumors that will rupture or could rupture immediately.  He does have cancer and several spots including his spleen but it is a slow growing cancer which can be treated at least prolonging life and giving better quality of life for an extended amount of time.  She had been emotionally preparing herself to have to make the decision about putting him down but that has been taken off the table for now because he is still very active and does not act like he does not feel good and she  is thankful for that.  The patient was very frustrated especially with family members who going to help her over a negative nature questioning her decision making.  That made the process of what she went through with her daughter even more difficult.  She did say that thankfully now she is not sleeping with 1 hoping and is sleeping fairly well and has gotten more work done in the past week that she had over the past several weeks.  She still has not had to go anywhere other than trips we talked about how she could challenge some of those anxious thoughts based on the doctor saying it will not happen suddenly.  She has decided not to go to her company's [facility] office or [place] because that feels like too much time away. Interventions: Cognitive Behavioral Therapy  Diagnosis: Posttraumatic stress disorder  Plan: I will meet with the patient every week primarily  via care agility Progress: 35% Treatment plan: We will use cognitive behavioral therapy as well as elements of ego supportive therapy and dialectical behavior therapy with a goal of reducing the patient's anxiety and depression by at least 50% by November 27, 2023.  Goals for depression include the patient having less depression as evidenced by her report in session as well as PH-9 scores, to have improved mood and for her to return to a healthier level of functioning.  We will look to identify causes for depressed mood and learn coping skills for reduction of depression.  Interventions including using cognitive behavioral therapy to help her explore and replace unhealthy thoughts and behaviors contributing to depression, processed how she experiences depression daily, provide education about depression to help her identify its causes and symptoms as well as sharing feelings of depression.  We will teach and encouraged the use of coping skills for management of depression.  Goals for anxiety are to improve her ability to manage anxiety  symptoms, panic symptoms and better handle stress.  We will identify causes for anxiety and panic and explore ways to reduce it as well as resolving and processing core conflicts that are contributing to anxiety and panic.  We will also work on managing worrisome thoughts contributing to feelings of anxiety and stress.  Interventions include providing education about anxiety to help her understand and identify its causes and symptoms, facilitate problem solution skills for reducing stress and anxiety as well as teach coping skills to manage anxiety such as grounding exercises, progressive muscle relaxation etc.  We will also use cognitive behavioral therapy to identify and change anxiety producing thoughts and behaviors as well as teach dialectical behavior therapy distress tolerance and mindfulness skills to help her learn anxiety management skills.  Goals for reducing trauma responses for PTSD include helping the patient recall the trauma without feeling overwhelmed or consumed with negative emotion as well as helping her to return to a pre trauma level of functioning.  Interventions include helping the patient identify the traumatic events impact her daily life as well as process feelings associated with the trauma including guilt and shame and sadness.  We will explore and as much detail as she is comfortable with sharing feelings regarding the trauma allowing for the gradual reduction of the emotional response through storytelling.  We will explore and reframe the patient's negative self talk associated with the trauma and identify negative self-defeating thoughts replacing it with positive thoughts and talk.  Lastly we will teach relaxation techniques to help her manage anxiety associated with the trauma.  Reviewed treatment goals and extended the goals with a new target date of May 25, 2024  French Ana, [facility]                                  French Ana,  [facility]               French Ana, [facility]

## 2024-01-29 DIAGNOSIS — M25521 Pain in right elbow: Secondary | ICD-10-CM | POA: Diagnosis not present

## 2024-02-08 DIAGNOSIS — M25521 Pain in right elbow: Secondary | ICD-10-CM | POA: Diagnosis not present

## 2024-02-10 ENCOUNTER — Ambulatory Visit (INDEPENDENT_AMBULATORY_CARE_PROVIDER_SITE_OTHER): Admitting: Behavioral Health

## 2024-02-10 ENCOUNTER — Encounter: Payer: Self-pay | Admitting: Behavioral Health

## 2024-02-10 DIAGNOSIS — F4310 Post-traumatic stress disorder, unspecified: Secondary | ICD-10-CM

## 2024-02-10 NOTE — Progress Notes (Signed)
 [facility] Counselor/Therapist Progress Note  Patient ID: Tara Jones, MRN: [id number],    Date:02/10/24  Time Spent.  4 PM until 4:58 PM, 58 minutes.This session was held via video teletherapy. The patient consented to the video teletherapy and was located in her office during this session. She is aware it is the responsibility of the patient to secure confidentiality on her end of the session. The provider was in a private home office for the duration of this session.    Treatment Type: Individual Therapy  Reported Symptoms: Anxiety/stress, depression  Mental Status Exam: Appearance:  Casual and Well Groomed     Behavior: Appropriate  Motor: Normal  Speech/Language:  Normal Rate  Affect: Appropriate  Mood: normal  Thought process: normal  Thought content:   WNL  Sensory/Perceptual disturbances:   WNL  Orientation: oriented to person, place, time/date, situation, day of week, month of year, and year  Attention: Good  Concentration: Good  Memory: WNL  Fund of knowledge:  Good  Insight:   Good  Judgment:  Good  Impulse Control: Good   Risk Assessment: Danger to Self:  No Self-injurious Behavior: No Danger to Others: No Duty to Warn:no Physical Aggression / Violence:No  Access to Firearms a concern: No  Gang Involvement:No   Subjective: The past 2 weeks have been difficult for the patient.  Recently he just after our last session she had an accident where she ended up landing on her right elbow fracturing it in 3 places.  She started off in a sling but is now in a hard cast which she can at least take off twice a day to do physical therapy but otherwise has to wear at least another couple of weeks not being able to bend it until she can go back to get more scans taken.  They feel like the fractures will heal well but they are concerned about the possible nerve damage.  They may have to do surgery or if the nerve damage is not as extensive do a nerve  conduction study to see what it looks like.  There is significant nerve pain and burning especially in her fingers and hand and she is limited in what pain medication she can take.  2 days ago she got some sort of virus and was running 100+ degree fever as well as congestion coughing which adds to her pain.  To top golf her parents and friends have been minimally supportive and she is limited to what she can do what she has to do everything left-handed.  She is discouraged and frustrated but in spite of it has stayed very positive.  She also got some good news in that her dog's cancer they think may be not as bad as they thought giving him at least a year to live if not possibly he could go into remission.  The patient is practicing his go to self-care she can based on the circumstances.  I will try to meet with her as close as weekly as possible.  She does contract for safety having no thoughts of hurting herself or anyone else.  Interventions: Cognitive Behavioral Therapy  Diagnosis: Posttraumatic stress disorder  Plan: I will meet with the patient every week primarily  via care agility Progress: 35% Treatment plan: We will use cognitive behavioral therapy as well as elements of ego supportive therapy and dialectical behavior therapy with a goal of reducing the patient's anxiety and depression by at least 50% by November 27, 2023.  Goals for depression include the patient having less depression as evidenced by her report in session as well as PH-9 scores, to have improved mood and for her to return to a healthier level of functioning.  We will look to identify causes for depressed mood and learn coping skills for reduction of depression.  Interventions including using cognitive behavioral therapy to help her explore and replace unhealthy thoughts and behaviors contributing to depression, processed how she experiences depression daily, provide education about depression to help her identify its causes and  symptoms as well as sharing feelings of depression.  We will teach and encouraged the use of coping skills for management of depression.  Goals for anxiety are to improve her ability to manage anxiety symptoms, panic symptoms and better handle stress.  We will identify causes for anxiety and panic and explore ways to reduce it as well as resolving and processing core conflicts that are contributing to anxiety and panic.  We will also work on managing worrisome thoughts contributing to feelings of anxiety and stress.  Interventions include providing education about anxiety to help her understand and identify its causes and symptoms, facilitate problem solution skills for reducing stress and anxiety as well as teach coping skills to manage anxiety such as grounding exercises, progressive muscle relaxation etc.  We will also use cognitive behavioral therapy to identify and change anxiety producing thoughts and behaviors as well as teach dialectical behavior therapy distress tolerance and mindfulness skills to help her learn anxiety management skills.  Goals for reducing trauma responses for PTSD include helping the patient recall the trauma without feeling overwhelmed or consumed with negative emotion as well as helping her to return to a pre trauma level of functioning.  Interventions include helping the patient identify the traumatic events impact her daily life as well as process feelings associated with the trauma including guilt and shame and sadness.  We will explore and as much detail as she is comfortable with sharing feelings regarding the trauma allowing for the gradual reduction of the emotional response through storytelling.  We will explore and reframe the patient's negative self talk associated with the trauma and identify negative self-defeating thoughts replacing it with positive thoughts and talk.  Lastly we will teach relaxation techniques to help her manage anxiety associated with the  trauma.  Reviewed treatment goals and extended the goals with a new target date of May 25, 2024  Cecile Coder, [facility]                                  Cecile Coder, [facility]               Cecile Coder, [facility]               Cecile Coder, [facility]

## 2024-02-18 DIAGNOSIS — G44009 Cluster headache syndrome, unspecified, not intractable: Secondary | ICD-10-CM | POA: Diagnosis not present

## 2024-02-18 DIAGNOSIS — J0100 Acute maxillary sinusitis, unspecified: Secondary | ICD-10-CM | POA: Diagnosis not present

## 2024-02-22 DIAGNOSIS — G5621 Lesion of ulnar nerve, right upper limb: Secondary | ICD-10-CM | POA: Diagnosis not present

## 2024-02-22 DIAGNOSIS — M25521 Pain in right elbow: Secondary | ICD-10-CM | POA: Diagnosis not present

## 2024-02-24 ENCOUNTER — Ambulatory Visit (INDEPENDENT_AMBULATORY_CARE_PROVIDER_SITE_OTHER): Admitting: Behavioral Health

## 2024-02-24 DIAGNOSIS — F4310 Post-traumatic stress disorder, unspecified: Secondary | ICD-10-CM

## 2024-02-24 NOTE — Progress Notes (Signed)
 [facility] Counselor/Therapist Progress Note  Patient ID: Tara Jones, MRN: [id number],    Date:02/24/24  Time Spent.  4 PM until 4:58 PM, 58 minutes.This session was held via video teletherapy. The patient consented to the video teletherapy and was located in her office during this session. She is aware it is the responsibility of the patient to secure confidentiality on her end of the session. The provider was in a private home office for the duration of this session.    Treatment Type: Individual Therapy  Reported Symptoms: Anxiety/stress, depression  Mental Status Exam: Appearance:  Casual and Well Groomed     Behavior: Appropriate  Motor: Normal  Speech/Language:  Normal Rate  Affect: Appropriate  Mood: normal  Thought process: normal  Thought content:   WNL  Sensory/Perceptual disturbances:   WNL  Orientation: oriented to person, place, time/date, situation, day of week, month of year, and year  Attention: Good  Concentration: Good  Memory: WNL  Fund of knowledge:  Good  Insight:   Good  Judgment:  Good  Impulse Control: Good   Risk Assessment: Danger to Self:  No Self-injurious Behavior: No Danger to Others: No Duty to Warn:no Physical Aggression / Violence:No  Access to Firearms a concern: No  Gang Involvement:No   Subjective: The past couple of weeks have been very difficult for the patient.  She found out that she has what is called complex regional pain in her elbow.  The bone is healing well but the nerves are concerning to the doctor.  There are 2 types of 1 of which there could be nerve damage and the other is not but both would require extensive recovery time.  They are looking at some different treatments before the possibility doing a nerve conduction study.  She is having stabbing as well as throbbing pains in her hand and her fingers which are concerning to the doctors.  She has to wear the brace/cast unless she is sitting still.  She  is having a hard time adjusting to doing everything left-handed.  This is all complicated by the fact that she has not slept well for a while.  When she thought her dog had cancer she did not sleep well because she thought things could happen immediately with his health.  That had gotten better and she started sleeping again until she fractured her elbow and now she has difficulty sleeping because of having the cast/brace on.  Approximately 1 week ago she could do nothing but sleep all day on Wednesday and on Thursday she woke up feeling some weight but having severe migraines.  She had to go to the doctor and they talked about hospitalization because she had become dehydrated because she was sleeping so much.  They sent her home with strict instructions to sleep every chance she got as well as to stay hydrated.  She said she has done just added she is starting to feel better.  She knows what signs to look for now.  For the most part her work has been very understanding of all that she has been through and been supportive.  She is just starting to feel like she can work again.  She said that for a while there she started to doubt that maybe she was "crazy.".  She said that her parents and some friends made her feel as if what she was going through was not legitimate but all the test approval that she really was okay and just  was going through a lot medically.  In the last couple of days she said she has taken a firm stance to trust her judgment and noted that if she does not feel well it is legitimate.  I encouraged her to continue to trust her instincts but to rest all that she could.  She will find out next week how her dog is doing on the treatment for cancer and if there is going to be a good chance of remission.  She says she is doing very well on the medication  I will continue to meet weekly for now with the patient.  She does contract for safety having no thoughts of hurting herself or anyone  else.  Interventions: Cognitive Behavioral Therapy  Diagnosis: Posttraumatic stress disorder  Plan: I will meet with the patient every week primarily  via care agility Progress: 35% Treatment plan: We will use cognitive behavioral therapy as well as elements of ego supportive therapy and dialectical behavior therapy with a goal of reducing the patient's anxiety and depression by at least 50% by November 27, 2023.  Goals for depression include the patient having less depression as evidenced by her report in session as well as PH-9 scores, to have improved mood and for her to return to a healthier level of functioning.  We will look to identify causes for depressed mood and learn coping skills for reduction of depression.  Interventions including using cognitive behavioral therapy to help her explore and replace unhealthy thoughts and behaviors contributing to depression, processed how she experiences depression daily, provide education about depression to help her identify its causes and symptoms as well as sharing feelings of depression.  We will teach and encouraged the use of coping skills for management of depression.  Goals for anxiety are to improve her ability to manage anxiety symptoms, panic symptoms and better handle stress.  We will identify causes for anxiety and panic and explore ways to reduce it as well as resolving and processing core conflicts that are contributing to anxiety and panic.  We will also work on managing worrisome thoughts contributing to feelings of anxiety and stress.  Interventions include providing education about anxiety to help her understand and identify its causes and symptoms, facilitate problem solution skills for reducing stress and anxiety as well as teach coping skills to manage anxiety such as grounding exercises, progressive muscle relaxation etc.  We will also use cognitive behavioral therapy to identify and change anxiety producing thoughts and behaviors as well  as teach dialectical behavior therapy distress tolerance and mindfulness skills to help her learn anxiety management skills.  Goals for reducing trauma responses for PTSD include helping the patient recall the trauma without feeling overwhelmed or consumed with negative emotion as well as helping her to return to a pre trauma level of functioning.  Interventions include helping the patient identify the traumatic events impact her daily life as well as process feelings associated with the trauma including guilt and shame and sadness.  We will explore and as much detail as she is comfortable with sharing feelings regarding the trauma allowing for the gradual reduction of the emotional response through storytelling.  We will explore and reframe the patient's negative self talk associated with the trauma and identify negative self-defeating thoughts replacing it with positive thoughts and talk.  Lastly we will teach relaxation techniques to help her manage anxiety associated with the trauma.  Reviewed treatment goals and extended the goals with a new target date of May 25, 2024  Cecile Coder, [facility]                                  Cecile Coder, [facility]               Cecile Coder, [facility]               Cecile Coder, [facility]               Cecile Coder, [facility]

## 2024-02-25 ENCOUNTER — Encounter: Payer: Self-pay | Admitting: Behavioral Health

## 2024-03-03 ENCOUNTER — Ambulatory Visit (INDEPENDENT_AMBULATORY_CARE_PROVIDER_SITE_OTHER): Admitting: Behavioral Health

## 2024-03-03 DIAGNOSIS — F4310 Post-traumatic stress disorder, unspecified: Secondary | ICD-10-CM | POA: Diagnosis not present

## 2024-03-04 ENCOUNTER — Encounter: Payer: Self-pay | Admitting: Behavioral Health

## 2024-03-04 NOTE — Progress Notes (Signed)
 [facility] Counselor/Therapist Progress Note  Patient ID: Tara Jones, MRN: [id number],    Date:03/03/24  Time Spent.  3PM until 3:58 PM, 58 minutes.This session was held via video teletherapy. The patient consented to the video teletherapy and was located in her office during this session. She is aware it is the responsibility of the patient to secure confidentiality on her end of the session. The provider was in a private home office for the duration of this session.    Treatment Type: Individual Therapy  Reported Symptoms: Anxiety/stress, depression  Mental Status Exam: Appearance:  Casual and Well Groomed     Behavior: Appropriate  Motor: Normal  Speech/Language:  Normal Rate  Affect: Appropriate  Mood: normal  Thought process: normal  Thought content:   WNL  Sensory/Perceptual disturbances:   WNL  Orientation: oriented to person, place, time/date, situation, day of week, month of year, and year  Attention: Good  Concentration: Good  Memory: WNL  Fund of knowledge:  Good  Insight:   Good  Judgment:  Good  Impulse Control: Good   Risk Assessment: Danger to Self:  No Self-injurious Behavior: No Danger to Others: No Duty to Warn:no Physical Aggression / Violence:No  Access to Firearms a concern: No  Gang Involvement:No   Subjective: The patient has got some good news in regards to her dog.  They do not feel like it is as bad as it was initially and are looking at treatment options moving forward.  She has started physical therapy with her arm but is too early to tell.  She said there are still times where there is extreme pain and numbness and tingling burning.  A friend and friend's mom had volunteered to help her and she is going to let them do that knowing that she is limited what she could do with 1 arm.  It is complicated by the fact her mother is in the hospital with congestive heart failure.  She woke up 1 morning to her mother calling her from  the hospital because she had difficulty breathing.  The patient said she had noticed signs weeks before and had said something to her mom but her mom had not taken care of her self.  Things are pretty critical initially but seemed to have stabilized some but she is frustrated.  Her dad is at the hospital with her mom and the patient is trying to visit her at night but setting limits knowing that she is still trying to work full-time and does not feel well.  She did say that in spite of all this going on for the most part she has managed her mood fairly well and is using coping skills to help keep her stress level as minimal as possible.  She does contract for safety having no thoughts of hurting herself or anyone else.  Interventions: Cognitive Behavioral Therapy  Diagnosis: Posttraumatic stress disorder  Plan: I will meet with the patient every week primarily  via care agility Progress: 35% Treatment plan: We will use cognitive behavioral therapy as well as elements of ego supportive therapy and dialectical behavior therapy with a goal of reducing the patient's anxiety and depression by at least 50% by November 27, 2023.  Goals for depression include the patient having less depression as evidenced by her report in session as well as PH-9 scores, to have improved mood and for her to return to a healthier level of functioning.  We will look to identify causes for  depressed mood and learn coping skills for reduction of depression.  Interventions including using cognitive behavioral therapy to help her explore and replace unhealthy thoughts and behaviors contributing to depression, processed how she experiences depression daily, provide education about depression to help her identify its causes and symptoms as well as sharing feelings of depression.  We will teach and encouraged the use of coping skills for management of depression.  Goals for anxiety are to improve her ability to manage anxiety symptoms,  panic symptoms and better handle stress.  We will identify causes for anxiety and panic and explore ways to reduce it as well as resolving and processing core conflicts that are contributing to anxiety and panic.  We will also work on managing worrisome thoughts contributing to feelings of anxiety and stress.  Interventions include providing education about anxiety to help her understand and identify its causes and symptoms, facilitate problem solution skills for reducing stress and anxiety as well as teach coping skills to manage anxiety such as grounding exercises, progressive muscle relaxation etc.  We will also use cognitive behavioral therapy to identify and change anxiety producing thoughts and behaviors as well as teach dialectical behavior therapy distress tolerance and mindfulness skills to help her learn anxiety management skills.  Goals for reducing trauma responses for PTSD include helping the patient recall the trauma without feeling overwhelmed or consumed with negative emotion as well as helping her to return to a pre trauma level of functioning.  Interventions include helping the patient identify the traumatic events impact her daily life as well as process feelings associated with the trauma including guilt and shame and sadness.  We will explore and as much detail as she is comfortable with sharing feelings regarding the trauma allowing for the gradual reduction of the emotional response through storytelling.  We will explore and reframe the patient's negative self talk associated with the trauma and identify negative self-defeating thoughts replacing it with positive thoughts and talk.  Lastly we will teach relaxation techniques to help her manage anxiety associated with the trauma.  Reviewed treatment goals and extended the goals with a new target date of May 25, 2024  Cecile Coder, [facility]                                  Cecile Coder,  [facility]               Cecile Coder, [facility]               Cecile Coder, [facility]               Cecile Coder, [facility]               Cecile Coder, [facility]

## 2024-03-16 ENCOUNTER — Encounter: Payer: Self-pay | Admitting: Behavioral Health

## 2024-03-16 ENCOUNTER — Ambulatory Visit (INDEPENDENT_AMBULATORY_CARE_PROVIDER_SITE_OTHER): Admitting: Behavioral Health

## 2024-03-16 DIAGNOSIS — F4310 Post-traumatic stress disorder, unspecified: Secondary | ICD-10-CM | POA: Diagnosis not present

## 2024-03-16 NOTE — Progress Notes (Signed)
 [facility] Counselor/Therapist Progress Note  Patient ID: Tara Jones, MRN: [id number],    Date:03/16/24  Time Spent.  3PM until 4 PM, 60 minutes.This session was held via video teletherapy. The patient consented to the video teletherapy and was located in her office during this session. She is aware it is the responsibility of the patient to secure confidentiality on her end of the session. The provider was in a private home office for the duration of this session.    Treatment Type: Individual Therapy  Reported Symptoms: Anxiety/stress, depression  Mental Status Exam: Appearance:  Casual and Well Groomed     Behavior: Appropriate  Motor: Normal  Speech/Language:  Normal Rate  Affect: Appropriate  Mood: normal  Thought process: normal  Thought content:   WNL  Sensory/Perceptual disturbances:   WNL  Orientation: oriented to person, place, time/date, situation, day of week, month of year, and year  Attention: Good  Concentration: Good  Memory: WNL  Fund of knowledge:  Good  Insight:   Good  Judgment:  Good  Impulse Control: Good   Risk Assessment: Danger to Self:  No Self-injurious Behavior: No Danger to Others: No Duty to Warn:no Physical Aggression / Violence:No  Access to Firearms a concern: No  Gang Involvement:No   Subjective: The patient has got some good news in regards to her dog.  They had started some IV treatment and he responded well so they are going to switch to oral chemo treatment and she feels much more optimistic.  She is still in significant pain.  She feels the physical therapy is helping some but she describes pain in her arm feeling like shocks or pinching which the physical therapist says is not unusual but is very uncomfortable.  It is hard for her to sleep because of what she has to wear and the pain that she is in.  She is trying to work that she can.  She is learning to do everything left-handed which is difficult.  She says  that she questions if at times she has some obsessive-compulsive tendencies so we looked at the DSM-5 and talked about what some of those tendencies are.  There do appear to be some tendencies but not necessarily a clear diagnosis.  She does report an increase in depression but understands that based on the physical pain she is in and not sleeping well that has not so unusual.  She did take a Xanax 0 that she felt like she had an episode but it did not help.  It appears to be more of a depressive episode than a manic one and I encouraged her to 2 talk to Dr. Moira Andrews about addressing that with medication if she felt there was small consistency to that.  I will meet with her weekly as possible. She does contract for safety having no thoughts of hurting herself or anyone else.  Interventions: Cognitive Behavioral Therapy  Diagnosis: Posttraumatic stress disorder  Plan: I will meet with the patient every week primarily  via care agility Progress: 35% Treatment plan: We will use cognitive behavioral therapy as well as elements of ego supportive therapy and dialectical behavior therapy with a goal of reducing the patient's anxiety and depression by at least 50% by November 27, 2023.  Goals for depression include the patient having less depression as evidenced by her report in session as well as PH-9 scores, to have improved mood and for her to return to a healthier level of functioning.  We  will look to identify causes for depressed mood and learn coping skills for reduction of depression.  Interventions including using cognitive behavioral therapy to help her explore and replace unhealthy thoughts and behaviors contributing to depression, processed how she experiences depression daily, provide education about depression to help her identify its causes and symptoms as well as sharing feelings of depression.  We will teach and encouraged the use of coping skills for management of depression.  Goals for anxiety are  to improve her ability to manage anxiety symptoms, panic symptoms and better handle stress.  We will identify causes for anxiety and panic and explore ways to reduce it as well as resolving and processing core conflicts that are contributing to anxiety and panic.  We will also work on managing worrisome thoughts contributing to feelings of anxiety and stress.  Interventions include providing education about anxiety to help her understand and identify its causes and symptoms, facilitate problem solution skills for reducing stress and anxiety as well as teach coping skills to manage anxiety such as grounding exercises, progressive muscle relaxation etc.  We will also use cognitive behavioral therapy to identify and change anxiety producing thoughts and behaviors as well as teach dialectical behavior therapy distress tolerance and mindfulness skills to help her learn anxiety management skills.  Goals for reducing trauma responses for PTSD include helping the patient recall the trauma without feeling overwhelmed or consumed with negative emotion as well as helping her to return to a pre trauma level of functioning.  Interventions include helping the patient identify the traumatic events impact her daily life as well as process feelings associated with the trauma including guilt and shame and sadness.  We will explore and as much detail as she is comfortable with sharing feelings regarding the trauma allowing for the gradual reduction of the emotional response through storytelling.  We will explore and reframe the patient's negative self talk associated with the trauma and identify negative self-defeating thoughts replacing it with positive thoughts and talk.  Lastly we will teach relaxation techniques to help her manage anxiety associated with the trauma.  Reviewed treatment goals and extended the goals with a new target date of May 25, 2024  Cecile Coder,  [facility]                                  Cecile Coder, [facility]               Cecile Coder, [facility]               Cecile Coder, [facility]               Cecile Coder, [facility]               Cecile Coder, [facility]               Cecile Coder, [facility]

## 2024-03-24 ENCOUNTER — Ambulatory Visit (INDEPENDENT_AMBULATORY_CARE_PROVIDER_SITE_OTHER): Admitting: Behavioral Health

## 2024-03-24 ENCOUNTER — Encounter: Payer: Self-pay | Admitting: Behavioral Health

## 2024-03-24 DIAGNOSIS — F4310 Post-traumatic stress disorder, unspecified: Secondary | ICD-10-CM

## 2024-03-24 NOTE — Progress Notes (Signed)
 [facility] Counselor/Therapist Progress Note  Patient ID: Tara Jones, MRN: [id number],    Date:03/24/24  Time Spent.  4PM until 5 PM, 60 minutes.This session was held via video teletherapy. The patient consented to the video teletherapy and was located in her office during this session. She is aware it is the responsibility of the patient to secure confidentiality on her end of the session. The provider was in a private home office for the duration of this session.    Treatment Type: Individual Therapy  Reported Symptoms: Anxiety/stress, depression  Mental Status Exam: Appearance:  Casual and Well Groomed     Behavior: Appropriate  Motor: Normal  Speech/Language:  Normal Rate  Affect: Appropriate  Mood: normal  Thought process: normal  Thought content:   WNL  Sensory/Perceptual disturbances:   WNL  Orientation: oriented to person, place, time/date, situation, day of week, month of year, and year  Attention: Good  Concentration: Good  Memory: WNL  Fund of knowledge:  Good  Insight:   Good  Judgment:  Good  Impulse Control: Good   Risk Assessment: Danger to Self:  No Self-injurious Behavior: No Danger to Others: No Duty to Warn:no Physical Aggression / Violence:No  Access to Firearms a concern: No  Gang Involvement:No   Subjective: The patient is feeling overwhelmed.  The car anginal-like came on and they were able to get the light off but think it might be related to the cold system.  If it happens again they will look at it again and oh charge but she is frustrated that her car she just bought is already doing that.  Her cat had a medical issue and she had to take it to the vet on the Saturday morning.  Thankfully was not too serious was able to be treated.  She is seeing some progress with physical therapy for her arm but is in pain all the time and that limits how well she can work because she has to work one-handed for the most part.  Thankfully her  work has been very understanding but she is concerned about how patient they will continue to be with her.  She will begin an MRI next week to see what kind of healing is taking place.  She has been having some good numbers conversations with her friend who also has been through some trauma.  She is thankful for the understanding but she says she feels as if once she starts to get some grounding in a positive direction something else happens and knocks her back about challenging those anxious or negative thoughts which she has been doing some efforts in doing so.  We also talked about medical about finding small things to have gratitude for.  She has not been doing checked on videos or her podcast because she has not felt like it.  She had an idea of trying all the milkshakes at cookout and making to talk videos of her response to that.  For homework I assigned her to start with that this week before her next session and she chose strawberry.  We talked about it being something that pushes her out of isolation but also gives her something to look forward to and gets her back out in front of people which she does well with.  I also brought up the possibility of looking into EMDR therapy and ask her to think about that.  I will send some information on that modality to her.  She does  contract for safety having no thoughts of hurting herself or anyone else.  Interventions: Cognitive Behavioral Therapy  Diagnosis: Posttraumatic stress disorder  Plan: I will meet with the patient every week primarily  via care agility Progress: 35% Treatment plan: We will use cognitive behavioral therapy as well as elements of ego supportive therapy and dialectical behavior therapy with a goal of reducing the patient's anxiety and depression by at least 50% by November 27, 2023.  Goals for depression include the patient having less depression as evidenced by her report in session as well as PH-9 scores, to have improved mood  and for her to return to a healthier level of functioning.  We will look to identify causes for depressed mood and learn coping skills for reduction of depression.  Interventions including using cognitive behavioral therapy to help her explore and replace unhealthy thoughts and behaviors contributing to depression, processed how she experiences depression daily, provide education about depression to help her identify its causes and symptoms as well as sharing feelings of depression.  We will teach and encouraged the use of coping skills for management of depression.  Goals for anxiety are to improve her ability to manage anxiety symptoms, panic symptoms and better handle stress.  We will identify causes for anxiety and panic and explore ways to reduce it as well as resolving and processing core conflicts that are contributing to anxiety and panic.  We will also work on managing worrisome thoughts contributing to feelings of anxiety and stress.  Interventions include providing education about anxiety to help her understand and identify its causes and symptoms, facilitate problem solution skills for reducing stress and anxiety as well as teach coping skills to manage anxiety such as grounding exercises, progressive muscle relaxation etc.  We will also use cognitive behavioral therapy to identify and change anxiety producing thoughts and behaviors as well as teach dialectical behavior therapy distress tolerance and mindfulness skills to help her learn anxiety management skills.  Goals for reducing trauma responses for PTSD include helping the patient recall the trauma without feeling overwhelmed or consumed with negative emotion as well as helping her to return to a pre trauma level of functioning.  Interventions include helping the patient identify the traumatic events impact her daily life as well as process feelings associated with the trauma including guilt and shame and sadness.  We will explore and as much  detail as she is comfortable with sharing feelings regarding the trauma allowing for the gradual reduction of the emotional response through storytelling.  We will explore and reframe the patient's negative self talk associated with the trauma and identify negative self-defeating thoughts replacing it with positive thoughts and talk.  Lastly we will teach relaxation techniques to help her manage anxiety associated with the trauma.  Reviewed treatment goals and extended the goals with a new target date of May 25, 2024  Cecile Coder, [facility]                                  Cecile Coder, [facility]               Cecile Coder, [facility]               Cecile Coder, [facility]               Cecile Coder, [facility]  Cecile Coder, [facility]               Cecile Coder, [facility]               Cecile Coder, [facility]

## 2024-03-31 ENCOUNTER — Encounter: Payer: Self-pay | Admitting: Behavioral Health

## 2024-03-31 ENCOUNTER — Ambulatory Visit (INDEPENDENT_AMBULATORY_CARE_PROVIDER_SITE_OTHER): Admitting: Behavioral Health

## 2024-03-31 DIAGNOSIS — F4310 Post-traumatic stress disorder, unspecified: Secondary | ICD-10-CM | POA: Diagnosis not present

## 2024-03-31 NOTE — Progress Notes (Signed)
 [facility] Counselor/Therapist Progress Note  Patient ID: Tara Jones, MRN: [id number],    Date:03/31/24  Time Spent.  4PM until 5 PM, 60 minutes.This session was held via video teletherapy. The patient consented to the video teletherapy and was located in her office during this session. She is aware it is the responsibility of the patient to secure confidentiality on her end of the session. The provider was in a private home office for the duration of this session.    Treatment Type: Individual Therapy  Reported Symptoms: Anxiety/stress, depression  Mental Status Exam: Appearance:  Casual and Well Groomed     Behavior: Appropriate  Motor: Normal  Speech/Language:  Normal Rate  Affect: Appropriate  Mood: normal  Thought process: normal  Thought content:   WNL  Sensory/Perceptual disturbances:   WNL  Orientation: oriented to person, place, time/date, situation, day of week, month of year, and year  Attention: Good  Concentration: Good  Memory: WNL  Fund of knowledge:  Good  Insight:   Good  Judgment:  Good  Impulse Control: Good   Risk Assessment: Danger to Self:  No Self-injurious Behavior: No Danger to Others: No Duty to Warn:no Physical Aggression / Violence:No  Access to Firearms a concern: No  Gang Involvement:No   Subjective: The patient is feeling overwhelmed.  It has been a tough week in terms of pain with her arm.  She spent 2 hours in physical therapy yesterday trying to find some way to tamp down the pain.  She did get a scan but does not have answers for that yet.  Sleep has been okay but not great.  She estimates she might get 2 to 3 hours of work done per day because it is so difficult to do it with one hand and she gets exhausted easily.  She is starting to do more things left-handed and is eating at home more.  There is some stress and anxiety about a couple of different situations.  Her rent renewal is due in a couple of days and they  stipulated no smoking on the property which she is struggling with.  She knows she needs to quit and wants to quit but does not think she can do it in the week's time that they are giving her.  We looked at a couple of different options for her to handle that signing not signing and if she does sign the best way to deal with that.  She also ran into some issues with family which she could have allowed herself to get caught in the middle.  She attempted to be supportive but saw the direction that was going and said some very clear boundaries. I also brought up the possibility of looking into EMDR therapy and ask her to think about that.  I will send some information on that modality to her.  She does contract for safety having no thoughts of hurting herself or anyone else.  Interventions: Cognitive Behavioral Therapy  Diagnosis: Posttraumatic stress disorder  Plan: I will meet with the patient every week primarily  via care agility Progress: 35% Treatment plan: We will use cognitive behavioral therapy as well as elements of ego supportive therapy and dialectical behavior therapy with a goal of reducing the patient's anxiety and depression by at least 50% by November 27, 2023.  Goals for depression include the patient having less depression as evidenced by her report in session as well as PH-9 scores, to have improved mood and for  her to return to a healthier level of functioning.  We will look to identify causes for depressed mood and learn coping skills for reduction of depression.  Interventions including using cognitive behavioral therapy to help her explore and replace unhealthy thoughts and behaviors contributing to depression, processed how she experiences depression daily, provide education about depression to help her identify its causes and symptoms as well as sharing feelings of depression.  We will teach and encouraged the use of coping skills for management of depression.  Goals for anxiety  are to improve her ability to manage anxiety symptoms, panic symptoms and better handle stress.  We will identify causes for anxiety and panic and explore ways to reduce it as well as resolving and processing core conflicts that are contributing to anxiety and panic.  We will also work on managing worrisome thoughts contributing to feelings of anxiety and stress.  Interventions include providing education about anxiety to help her understand and identify its causes and symptoms, facilitate problem solution skills for reducing stress and anxiety as well as teach coping skills to manage anxiety such as grounding exercises, progressive muscle relaxation etc.  We will also use cognitive behavioral therapy to identify and change anxiety producing thoughts and behaviors as well as teach dialectical behavior therapy distress tolerance and mindfulness skills to help her learn anxiety management skills.  Goals for reducing trauma responses for PTSD include helping the patient recall the trauma without feeling overwhelmed or consumed with negative emotion as well as helping her to return to a pre trauma level of functioning.  Interventions include helping the patient identify the traumatic events impact her daily life as well as process feelings associated with the trauma including guilt and shame and sadness.  We will explore and as much detail as she is comfortable with sharing feelings regarding the trauma allowing for the gradual reduction of the emotional response through storytelling.  We will explore and reframe the patient's negative self talk associated with the trauma and identify negative self-defeating thoughts replacing it with positive thoughts and talk.  Lastly we will teach relaxation techniques to help her manage anxiety associated with the trauma.  Reviewed treatment goals and extended the goals with a new target date of May 25, 2024  Cecile Coder,  [facility]                                  Cecile Coder, [facility]               Cecile Coder, [facility]               Cecile Coder, [facility]               Cecile Coder, [facility]               Cecile Coder, [facility]               Cecile Coder, [facility]               Cecile Coder, [facility]               Cecile Coder, [facility]

## 2024-04-07 ENCOUNTER — Encounter: Payer: Self-pay | Admitting: Behavioral Health

## 2024-04-07 ENCOUNTER — Ambulatory Visit: Admitting: Behavioral Health

## 2024-04-07 DIAGNOSIS — F4310 Post-traumatic stress disorder, unspecified: Secondary | ICD-10-CM | POA: Diagnosis not present

## 2024-04-07 NOTE — Progress Notes (Signed)
 [facility] Counselor/Therapist Progress Note  Patient ID: Tara Jones, MRN: [id number],    Date:04/07/24  Time Spent.  11:05 AM until 12 PM, 55  minutes.This session was held via video teletherapy. The patient consented to the video teletherapy and was located in her office during this session. She is aware it is the responsibility of the patient to secure confidentiality on her end of the session. The provider was in a private home office for the duration of this session.    Treatment Type: Individual Therapy  Reported Symptoms: Anxiety/stress, depression  Mental Status Exam: Appearance:  Casual and Well Groomed     Behavior: Appropriate  Motor: Normal  Speech/Language:  Normal Rate  Affect: Appropriate  Mood: normal  Thought process: normal  Thought content:   WNL  Sensory/Perceptual disturbances:   WNL  Orientation: oriented to person, place, time/date, situation, day of week, month of year, and year  Attention: Good  Concentration: Good  Memory: WNL  Fund of knowledge:  Good  Insight:   Good  Judgment:  Good  Impulse Control: Good   Risk Assessment: Danger to Self:  No Self-injurious Behavior: No Danger to Others: No Duty to Warn:no Physical Aggression / Violence:No  Access to Firearms a concern: No  Gang Involvement:No   Subjective: The patient did get some news on her arm but is still not completely conclusive.  Thankfully the bone is okay and they feel that it is the ulna nerve but there may be some other nerve issues.  They will continue treatment for the next 3 weeks for getting another test to see if there is some more specific nerve issues going on.  There is a possibility of surgery.  The patient does not want surgery but says at least if she knows exactly what is causing problems they can fix it.  On top of all that she found out that a brown recluse spider bit her on her thigh.  She says that she went to urgent care and they treated it and  it seems to have slowed down but it is still there and there is growth so she is going back again today.  She feels overwhelmed and feels as if the other she was constantly going to drop.  She is having difficulty working because of her arm but thankfully work is being fairly understanding.  We acknowledge the reality of so many difficult things happening to her and encouraged her to challenge anxious thoughts and try to compartmentalize things as much as she can to be able to get through the day to day.  Encouraged her to use coping skills as a way to tamp down her anxiety.  She knows about 26 if she just needs someone to talk to when friends or family are not available throughout the week.  She does contract for safety having no thoughts of hurting herself or anyone else.  Interventions: Cognitive Behavioral Therapy  Diagnosis: Posttraumatic stress disorder  Plan: I will meet with the patient every week primarily  via care agility Progress: 35% Treatment plan: We will use cognitive behavioral therapy as well as elements of ego supportive therapy and dialectical behavior therapy with a goal of reducing the patient's anxiety and depression by at least 50% by November 27, 2023.  Goals for depression include the patient having less depression as evidenced by her report in session as well as PH-9 scores, to have improved mood and for her to return to a healthier level  of functioning.  We will look to identify causes for depressed mood and learn coping skills for reduction of depression.  Interventions including using cognitive behavioral therapy to help her explore and replace unhealthy thoughts and behaviors contributing to depression, processed how she experiences depression daily, provide education about depression to help her identify its causes and symptoms as well as sharing feelings of depression.  We will teach and encouraged the use of coping skills for management of depression.  Goals for anxiety  are to improve her ability to manage anxiety symptoms, panic symptoms and better handle stress.  We will identify causes for anxiety and panic and explore ways to reduce it as well as resolving and processing core conflicts that are contributing to anxiety and panic.  We will also work on managing worrisome thoughts contributing to feelings of anxiety and stress.  Interventions include providing education about anxiety to help her understand and identify its causes and symptoms, facilitate problem solution skills for reducing stress and anxiety as well as teach coping skills to manage anxiety such as grounding exercises, progressive muscle relaxation etc.  We will also use cognitive behavioral therapy to identify and change anxiety producing thoughts and behaviors as well as teach dialectical behavior therapy distress tolerance and mindfulness skills to help her learn anxiety management skills.  Goals for reducing trauma responses for PTSD include helping the patient recall the trauma without feeling overwhelmed or consumed with negative emotion as well as helping her to return to a pre trauma level of functioning.  Interventions include helping the patient identify the traumatic events impact her daily life as well as process feelings associated with the trauma including guilt and shame and sadness.  We will explore and as much detail as she is comfortable with sharing feelings regarding the trauma allowing for the gradual reduction of the emotional response through storytelling.  We will explore and reframe the patient's negative self talk associated with the trauma and identify negative self-defeating thoughts replacing it with positive thoughts and talk.  Lastly we will teach relaxation techniques to help her manage anxiety associated with the trauma.  Reviewed treatment goals and extended the goals with a new target date of May 25, 2024  Cecile Coder,  [facility]                                  Cecile Coder, [facility]               Cecile Coder, [facility]               Cecile Coder, [facility]               Cecile Coder, [facility]               Cecile Coder, [facility]               Cecile Coder, [facility]               Cecile Coder, [facility]               Cecile Coder, [facility]               Cecile Coder, [facility]

## 2024-04-14 ENCOUNTER — Encounter: Payer: Self-pay | Admitting: Behavioral Health

## 2024-04-14 ENCOUNTER — Ambulatory Visit (INDEPENDENT_AMBULATORY_CARE_PROVIDER_SITE_OTHER): Admitting: Behavioral Health

## 2024-04-14 DIAGNOSIS — F4310 Post-traumatic stress disorder, unspecified: Secondary | ICD-10-CM

## 2024-04-14 NOTE — Progress Notes (Signed)
 [facility] Counselor/Therapist Progress Note  Patient ID: DEZARAI PREW, MRN: [id number],    Date:04/14/24  Time Spent.  3 PM until 3:58 PM, 58 minutes.This session was held via video teletherapy. The patient consented to the video teletherapy and was located in her office during this session. She is aware it is the responsibility of the patient to secure confidentiality on her end of the session. The provider was in a private home office for the duration of this session.    Treatment Type: Individual Therapy  Reported Symptoms: Anxiety/stress, depression  Mental Status Exam: Appearance:  Casual and Well Groomed     Behavior: Appropriate  Motor: Normal  Speech/Language:  Normal Rate  Affect: Appropriate  Mood: normal  Thought process: normal  Thought content:   WNL  Sensory/Perceptual disturbances:   WNL  Orientation: oriented to person, place, time/date, situation, day of week, month of year, and year  Attention: Good  Concentration: Good  Memory: WNL  Fund of knowledge:  Good  Insight:   Good  Judgment:  Good  Impulse Control: Good   Risk Assessment: Danger to Self:  No Self-injurious Behavior: No Danger to Others: No Duty to Warn:no Physical Aggression / Violence:No  Access to Firearms a concern: No  Gang Involvement:No   Subjective: The patient has been in a lot of pain over the past week saying is now radiating into her back.  She does have additional test Monday of next week but will not know answers until July 7.  She still continues to do feel the physical therapy and believes that it is being beneficial to her.  She has not been able to work much the past 2 weeks because of the pain but her manage her is being very understanding and she is thankful for that.  She recognizes that in a lot of other jobs she would not have been given the flexibility.  She has been much more reflective over the past week.  She acknowledged a day in which she was  coming home from somewhere and allowed some of the things that have happened to her to stack up on each other and said she felt temporarily overwhelmed.  She recognized the depression and said that she just wanted to sleep but instead spent some time to think through it.  She says that she feels that so much has happened to her over her lifetime that she started to normalize the fact that bad things were just going to happen to her.  She recognizes that her arm was just an accident and that her dog and cat having medical issues were not something that she had anything to do with and she is trying to put that into perspective.  She went back to look at a video of her from January of this year.  She said that she looked much brighter and happier and could recall the amount of work that she had done to become mentally and emotionally a healthier and at least immediately felt like she had regressed significantly.  I challenged her to look at that time in her life as a platform for all the work that she had put in that was still stable and she could move forward from.  We focused on what her strengths are and the fact that she is strong and resilient and that is what will carry her through this.  We also will continue to work on changing the mindset that bad things are going  to continue to happen to her.  We also looked at her time in the [employer] when she was in high school and how that has shaped her thinking and also how she interacts with other people especially who do not see what she sees or sees things differently. She does contract for safety having no thoughts of hurting herself or anyone else.  Interventions: Cognitive Behavioral Therapy  Diagnosis: Posttraumatic stress disorder  Plan: I will meet with the patient every week primarily  via care agility Progress: 35% Treatment plan: We will use cognitive behavioral therapy as well as elements of ego supportive therapy and  dialectical behavior therapy with a goal of reducing the patient's anxiety and depression by at least 50% by November 27, 2023.  Goals for depression include the patient having less depression as evidenced by her report in session as well as PH-9 scores, to have improved mood and for her to return to a healthier level of functioning.  We will look to identify causes for depressed mood and learn coping skills for reduction of depression.  Interventions including using cognitive behavioral therapy to help her explore and replace unhealthy thoughts and behaviors contributing to depression, processed how she experiences depression daily, provide education about depression to help her identify its causes and symptoms as well as sharing feelings of depression.  We will teach and encouraged the use of coping skills for management of depression.  Goals for anxiety are to improve her ability to manage anxiety symptoms, panic symptoms and better handle stress.  We will identify causes for anxiety and panic and explore ways to reduce it as well as resolving and processing core conflicts that are contributing to anxiety and panic.  We will also work on managing worrisome thoughts contributing to feelings of anxiety and stress.  Interventions include providing education about anxiety to help her understand and identify its causes and symptoms, facilitate problem solution skills for reducing stress and anxiety as well as teach coping skills to manage anxiety such as grounding exercises, progressive muscle relaxation etc.  We will also use cognitive behavioral therapy to identify and change anxiety producing thoughts and behaviors as well as teach dialectical behavior therapy distress tolerance and mindfulness skills to help her learn anxiety management skills.  Goals for reducing trauma responses for PTSD include helping the patient recall the trauma without feeling overwhelmed or consumed with negative emotion as well as  helping her to return to a pre trauma level of functioning.  Interventions include helping the patient identify the traumatic events impact her daily life as well as process feelings associated with the trauma including guilt and shame and sadness.  We will explore and as much detail as she is comfortable with sharing feelings regarding the trauma allowing for the gradual reduction of the emotional response through storytelling.  We will explore and reframe the patient's negative self talk associated with the trauma and identify negative self-defeating thoughts replacing it with positive thoughts and talk.  Lastly we will teach relaxation techniques to help her manage anxiety associated with the trauma.  Reviewed treatment goals and extended the goals with a new target date of May 25, 2024  Cecile Coder, [facility]                                  Cecile Coder, [facility]  Cecile Coder, [facility]               Cecile Coder, [facility]               Cecile Coder, [facility]               Cecile Coder, [facility]               Cecile Coder, [facility]               Cecile Coder, [facility]               Cecile Coder, [facility]               Cecile Coder, [facility]               Cecile Coder, [facility]

## 2024-04-21 ENCOUNTER — Encounter: Payer: Self-pay | Admitting: Behavioral Health

## 2024-04-21 ENCOUNTER — Ambulatory Visit (INDEPENDENT_AMBULATORY_CARE_PROVIDER_SITE_OTHER): Admitting: Behavioral Health

## 2024-04-21 DIAGNOSIS — F4310 Post-traumatic stress disorder, unspecified: Secondary | ICD-10-CM | POA: Diagnosis not present

## 2024-04-21 NOTE — Progress Notes (Signed)
 [facility] Counselor/Therapist Progress Note  Patient ID: SHARMILA WROBLESKI, MRN: [id number],    Date:04/21/24  Time Spent. 8:02 am to 9:01 AM, 59 minutes.This session was held via video teletherapy. The patient consented to the video teletherapy and was located in her office during this session. She is aware it is the responsibility of the patient to secure confidentiality on her end of the session. The provider was in a private home office for the duration of this session.    Treatment Type: Individual Therapy  Reported Symptoms: Anxiety/stress, depression  Mental Status Exam: Appearance:  Casual and Well Groomed     Behavior: Appropriate  Motor: Normal  Speech/Language:  Normal Rate  Affect: Appropriate  Mood: normal  Thought process: normal  Thought content:   WNL  Sensory/Perceptual disturbances:   WNL  Orientation: oriented to person, place, time/date, situation, day of week, month of year, and year  Attention: Good  Concentration: Good  Memory: WNL  Fund of knowledge:  Good  Insight:   Good  Judgment:  Good  Impulse Control: Good   Risk Assessment: Danger to Self:  No Self-injurious Behavior: No Danger to Others: No Duty to Warn:no Physical Aggression / Violence:No  Access to Firearms a concern: No  Gang Involvement:No   Subjective: The patient feels that every time she is making progress something comes along knocks her back a few steps.  She found out 2 days ago some things are going to change vocationally and she is not sure how that will affect her.  That has created some anxiety for her she is trying not to project and see what will take place over the next few weeks.  She did do this can all of her elbow down into her hand in the doctor to read the report indicated that some of the nerves are either dying or possibly dad and she might lose some use or feeling in her hand.  She will meet with another doctor next week to find out definitive results  and talked about possibilities.  She is of the mindset that if he thinks her she will make a difference that she wants to go ahead and get that done because if she continues in physical therapy without doing anything else best case scenario was 6-12 more months with no guarantee of regeneration of some of those nerves.  Because of all this going on with her medically as well as with her animals there is significant financial stress.  Thankfully the issue with the car was under warranty and easily fixed.  I encouraged compartmentalization and challenging those negative anxious thoughts and encouraged continued use of coping skills.  I will meet with the patient weekly. She does contract for safety having no thoughts of hurting herself or anyone else.  Interventions: Cognitive Behavioral Therapy  Diagnosis: Posttraumatic stress disorder  Plan: I will meet with the patient every week primarily  via care agility Progress: 35% Treatment plan: We will use cognitive behavioral therapy as well as elements of ego supportive therapy and dialectical behavior therapy with a goal of reducing the patient's anxiety and depression by at least 50% by November 27, 2023.  Goals for depression include the patient having less depression as evidenced by her report in session as well as PH-9 scores, to have improved mood and for her to return to a healthier level of functioning.  We will look to identify causes for depressed mood and learn coping skills for reduction of depression.  Interventions including using cognitive behavioral therapy to help her explore and replace unhealthy thoughts and behaviors contributing to depression, processed how she experiences depression daily, provide education about depression to help her identify its causes and symptoms as well as sharing feelings of depression.  We will teach and encouraged the use of coping skills for management of depression.  Goals for anxiety are to improve her  ability to manage anxiety symptoms, panic symptoms and better handle stress.  We will identify causes for anxiety and panic and explore ways to reduce it as well as resolving and processing core conflicts that are contributing to anxiety and panic.  We will also work on managing worrisome thoughts contributing to feelings of anxiety and stress.  Interventions include providing education about anxiety to help her understand and identify its causes and symptoms, facilitate problem solution skills for reducing stress and anxiety as well as teach coping skills to manage anxiety such as grounding exercises, progressive muscle relaxation etc.  We will also use cognitive behavioral therapy to identify and change anxiety producing thoughts and behaviors as well as teach dialectical behavior therapy distress tolerance and mindfulness skills to help her learn anxiety management skills.  Goals for reducing trauma responses for PTSD include helping the patient recall the trauma without feeling overwhelmed or consumed with negative emotion as well as helping her to return to a pre trauma level of functioning.  Interventions include helping the patient identify the traumatic events impact her daily life as well as process feelings associated with the trauma including guilt and shame and sadness.  We will explore and as much detail as she is comfortable with sharing feelings regarding the trauma allowing for the gradual reduction of the emotional response through storytelling.  We will explore and reframe the patient's negative self talk associated with the trauma and identify negative self-defeating thoughts replacing it with positive thoughts and talk.  Lastly we will teach relaxation techniques to help her manage anxiety associated with the trauma.  Reviewed treatment goals and extended the goals with a new target date of May 25, 2024  Lorrene CHRISTELLA Hasten,  [facility]                                  Lorrene CHRISTELLA Hasten, [facility]               Lorrene CHRISTELLA Hasten, [facility]               Lorrene CHRISTELLA Hasten, [facility]               Lorrene CHRISTELLA Hasten, [facility]               Lorrene CHRISTELLA Hasten, [facility]               Lorrene CHRISTELLA Hasten, [facility]               Lorrene CHRISTELLA Hasten, [facility]               Lorrene CHRISTELLA Hasten, [facility]               Lorrene CHRISTELLA Hasten, [facility]               Lorrene CHRISTELLA Hasten, [facility]               Lorrene CHRISTELLA Hasten, [facility]

## 2024-04-28 ENCOUNTER — Ambulatory Visit: Admitting: Behavioral Health

## 2024-04-28 DIAGNOSIS — F4310 Post-traumatic stress disorder, unspecified: Secondary | ICD-10-CM

## 2024-04-28 NOTE — Progress Notes (Signed)
 Tara Jones  Patient ID: Tara Jones, MRN: [id number],    Date:04/28/24  Time Spent.  4 PM until 4:59 PM, 59 minutes.This session was held via video teletherapy. The patient consented to the video teletherapy and was located in her office during this session. She is aware it is the responsibility of the patient to secure confidentiality on her end of the session. The provider was in a private home office for the duration of this session.    Treatment Type: Individual Therapy  Reported Symptoms: Anxiety/stress, depression  Mental Status Exam: Appearance:  Casual and Well Groomed     Behavior: Appropriate  Motor: Normal  Speech/Language:  Normal Rate  Affect: Appropriate  Mood: normal  Thought process: normal  Thought content:   WNL  Sensory/Perceptual disturbances:   WNL  Orientation: oriented to person, place, time/date, situation, day of week, month of year, and year  Attention: Good  Concentration: Good  Memory: WNL  Fund of knowledge:  Good  Insight:   Good  Judgment:  Good  Impulse Control: Good   Risk Assessment: Danger to Self:  No Self-injurious Behavior: No Danger to Others: No Duty to Warn:no Physical Aggression / Violence:No  Access to Firearms a concern: No  Gang Involvement:No   Subjective: The patient met with the surgeon and got results of her scan.  There is more nerve damage that they follow up and they are going to have to do Tara and is scheduled for July 15.  She said they feel like she will get movement in her arm back and should be in much better shape in terms of pain reduction but she may lose some use of her hand or strength in her hand.  Right now her hand strength is at about a 6 on her dominant right hand when normal it should be 60-65 which is in her left hand. DePending on the extent of the damage she could have 20% use of her hand up to 80% use of her hand.  She feels that she is not being  supported very well from family or friends and is concerned about the aftercare.  Her grandmother will, but not until a week after the Tara.  She will have to depend on her parents in the week immediately following the schedule for food for taking care of her pets etc.  Financially has been very stressful although she has almost met her deductible and that some relief.  She has told work about it and they did not react very strongly but told her to submit work request and it would be taken care of.  I encouraged her to try and compartmentalize the issue knowing that she is going to get significant relief from the Tara which she would not get if nothing was done.  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  She does contract for safety having no thoughts of hurting herself or anyone else.  Interventions: Cognitive Behavioral Therapy  Diagnosis: Posttraumatic stress disorder  Plan: I will meet with the patient every week primarily  via care agility Progress: 35% Treatment plan: We will use cognitive behavioral therapy as well as elements of ego supportive therapy and dialectical behavior therapy with a goal of reducing the patient's anxiety and depression by at least 50% by November 27, 2023.  Goals for depression  include the patient having less depression as evidenced by her report in session as well as PH-9 scores, to have improved mood and for her to return to a healthier level of functioning.  We will look to identify causes for depressed mood and learn coping skills for reduction of depression.  Interventions including using cognitive behavioral therapy to help her explore and replace unhealthy thoughts and behaviors contributing to depression, processed how she experiences depression daily, provide education about depression to help her identify its causes and symptoms as well as  sharing feelings of depression.  We will teach and encouraged the use of coping skills for management of depression.  Goals for anxiety are to improve her ability to manage anxiety symptoms, panic symptoms and better handle stress.  We will identify causes for anxiety and panic and explore ways to reduce it as well as resolving and processing core conflicts that are contributing to anxiety and panic.  We will also work on managing worrisome thoughts contributing to feelings of anxiety and stress.  Interventions include providing education about anxiety to help her understand and identify its causes and symptoms, facilitate problem solution skills for reducing stress and anxiety as well as teach coping skills to manage anxiety such as grounding exercises, progressive muscle relaxation etc.  We will also use cognitive behavioral therapy to identify and change anxiety producing thoughts and behaviors as well as teach dialectical behavior therapy distress tolerance and mindfulness skills to help her learn anxiety management skills.  Goals for reducing trauma responses for PTSD include helping the patient recall the trauma without feeling overwhelmed or consumed with negative emotion as well as helping her to return to a pre trauma level of functioning.  Interventions include helping the patient identify the traumatic events impact her daily life as well as process feelings associated with the trauma including guilt and shame and sadness.  We will explore and as much detail as she is comfortable with sharing feelings regarding the trauma allowing for the gradual reduction of the emotional response through storytelling.  We will explore and reframe the patient's negative self talk associated with the trauma and identify negative self-defeating thoughts replacing it with positive thoughts and talk.  Lastly we will teach relaxation techniques to help her manage anxiety associated with the trauma.  Reviewed treatment  goals and extended the goals with a new target date of May 25, 2024  Tara Jones, Tara Jones                                  Tara Jones, Tara Jones               Tara Jones, Tara Jones               Tara Jones, Tara Jones               Tara Jones, Tara Jones               Tara Jones, Tara Jones               Tara Jones, Tara Jones               Tara Jones, Tara Jones               Tara Jones, Tara Jones               Tara Jones, Tara Jones  Tara Jones, Tara Jones               Tara Jones, [facility]               Tara Jones, [place] Tara Jones

## 2024-05-04 ENCOUNTER — Ambulatory Visit (INDEPENDENT_AMBULATORY_CARE_PROVIDER_SITE_OTHER): Admitting: Behavioral Health

## 2024-05-04 ENCOUNTER — Encounter: Payer: Self-pay | Admitting: Behavioral Health

## 2024-05-04 DIAGNOSIS — F902 Attention-deficit hyperactivity disorder, combined type: Secondary | ICD-10-CM

## 2024-05-04 DIAGNOSIS — F4010 Social phobia, unspecified: Secondary | ICD-10-CM

## 2024-05-04 DIAGNOSIS — F4310 Post-traumatic stress disorder, unspecified: Secondary | ICD-10-CM

## 2024-05-04 NOTE — Progress Notes (Signed)
 [facility] Counselor/Therapist Progress Note  Patient ID: Tara Jones, MRN: [id number],    Date:05/04/24  Time Spent.  4 PM until 4:59 PM, 59 minutes.This session was held via video teletherapy. The patient consented to the video teletherapy and was located in her office during this session. She is aware it is the responsibility of the patient to secure confidentiality on her end of the session. The provider was in a private home office for the duration of this session.    Treatment Type: Individual Therapy  Reported Symptoms: Anxiety/stress, depression  Mental Status Exam: Appearance:  Casual and Well Groomed     Behavior: Appropriate  Motor: Normal  Speech/Language:  Normal Rate  Affect: Appropriate  Mood: normal  Thought process: normal  Thought content:   WNL  Sensory/Perceptual disturbances:   WNL  Orientation: oriented to person, place, time/date, situation, day of week, month of year, and year  Attention: Good  Concentration: Good  Memory: WNL  Fund of knowledge:  Good  Insight:   Good  Judgment:  Good  Impulse Control: Good   Risk Assessment: Danger to Self:  No Self-injurious Behavior: No Danger to Others: No Duty to Warn:no Physical Aggression / Violence:No  Access to Firearms a concern: No  Gang Involvement:No   Subjective: The patient is scheduled for surgery on August 15 at 9:00.  His work down some of the financial struggles and some supports for post surgery.  He has been supportive of her being ill.  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  She says that for the most part she has come to terms with the surgery because she knows it improves her odds of keeping strength or gaining more strength in her hand.  She still can move her fingers but does not have a lot of strength to squeeze anything and her eyes are improved  by the surgery.  Feels that she is managing her anxiety fairly well surrounding this. She does contract for safety having no thoughts of hurting herself or anyone else.  Interventions: Cognitive Behavioral Therapy  Diagnosis: Posttraumatic stress disorder  Plan: I will meet with the patient every week primarily  via care agility Progress: 35% Treatment plan: We will use cognitive behavioral therapy as well as elements of ego supportive therapy and dialectical behavior therapy with a goal of reducing the patient's anxiety and depression by at least 50% by November 27, 2023.  Goals for depression include the patient having less depression as evidenced by her report in session as well as PH-9 scores, to have improved mood and for her to return to a healthier level of functioning.  We will look to identify causes for depressed mood and learn coping skills for reduction of depression.  Interventions including using cognitive behavioral therapy to help her explore and replace unhealthy thoughts and behaviors contributing to depression, processed how she experiences depression daily, provide education about depression to help her identify its causes and symptoms as well as sharing feelings of depression.  We will teach and encouraged the use of coping skills for management of depression.  Goals for anxiety are to improve her ability to manage anxiety symptoms, panic symptoms and better handle stress.  We will identify causes for anxiety and panic and explore ways to reduce it as well as resolving and processing core conflicts that are contributing to anxiety and  panic.  We will also work on managing worrisome thoughts contributing to feelings of anxiety and stress.  Interventions include providing education about anxiety to help her understand and identify its causes and symptoms, facilitate problem solution skills for reducing stress and anxiety as well as teach coping skills to manage anxiety such as grounding  exercises, progressive muscle relaxation etc.  We will also use cognitive behavioral therapy to identify and change anxiety producing thoughts and behaviors as well as teach dialectical behavior therapy distress tolerance and mindfulness skills to help her learn anxiety management skills.  Goals for reducing trauma responses for PTSD include helping the patient recall the trauma without feeling overwhelmed or consumed with negative emotion as well as helping her to return to a pre trauma level of functioning.  Interventions include helping the patient identify the traumatic events impact her daily life as well as process feelings associated with the trauma including guilt and shame and sadness.  We will explore and as much detail as she is comfortable with sharing feelings regarding the trauma allowing for the gradual reduction of the emotional response through storytelling.  We will explore and reframe the patient's negative self talk associated with the trauma and identify negative self-defeating thoughts replacing it with positive thoughts and talk.  Lastly we will teach relaxation techniques to help her manage anxiety associated with the trauma.  Reviewed treatment goals and extended the goals with a new target date of May 25, 2024  Lorrene CHRISTELLA Hasten, [facility]                                  Lorrene CHRISTELLA Hasten, [facility]               Lorrene CHRISTELLA Hasten, [facility]               Lorrene CHRISTELLA Hasten, [facility]               Lorrene CHRISTELLA Hasten, [facility]               Lorrene CHRISTELLA Hasten, [facility]               Lorrene CHRISTELLA Hasten, [facility]               Lorrene CHRISTELLA Hasten, [facility]               Lorrene CHRISTELLA Hasten, [facility]               Lorrene CHRISTELLA Hasten, [facility]               Lorrene CHRISTELLA Hasten, [facility]               Lorrene CHRISTELLA Hasten, [facility]               Lorrene CHRISTELLA Hasten,  [facility]               Lorrene CHRISTELLA Hasten, [facility]

## 2024-05-11 ENCOUNTER — Ambulatory Visit (INDEPENDENT_AMBULATORY_CARE_PROVIDER_SITE_OTHER): Admitting: Behavioral Health

## 2024-05-11 DIAGNOSIS — F4310 Post-traumatic stress disorder, unspecified: Secondary | ICD-10-CM | POA: Diagnosis not present

## 2024-05-11 NOTE — Progress Notes (Signed)
 [facility] Counselor/Therapist Progress Note  Patient ID: LAROSA RHINES, MRN: [id number],    Date:05/11/24  Time Spent.  403 PM until 5;00 PM, 57 minutes.This session was held via video teletherapy. The patient consented to the video teletherapy and was located in her office during this session. She is aware it is the responsibility of the patient to secure confidentiality on her end of the session. The provider was in a private home office for the duration of this session.    Treatment Type: Individual Therapy  Reported Symptoms: Anxiety/stress, depression  Mental Status Exam: Appearance:  Casual and Well Groomed     Behavior: Appropriate  Motor: Normal  Speech/Language:  Normal Rate  Affect: Appropriate  Mood: normal  Thought process: normal  Thought content:   WNL  Sensory/Perceptual disturbances:   WNL  Orientation: oriented to person, place, time/date, situation, day of week, month of year, and year  Attention: Good  Concentration: Good  Memory: WNL  Fund of knowledge:  Good  Insight:   Good  Judgment:  Good  Impulse Control: Good   Risk Assessment: Danger to Self:  No Self-injurious Behavior: No Danger to Others: No Duty to Warn:no Physical Aggression / Violence:No  Access to Firearms a concern: No  Gang Involvement:No   Subjective: The patient surgery went well yesterday.  They did have to move the nerve which puts her in a better place for healing.  She has been in significant pain and did not sleep much last night but is starting back on her sleep medication tonight.  She said that basically she has is resting and sleeping as much as she can.  Is painful to walk because of operates her arm and she has to be careful how she lays down.  She knows that she will get the splint off and 6 more days and then see the surgeon another week after that.  She is optimistic that this will make her recovery in terms of movement and pain reduction significantly  better.  She also quit smoking and feels that she can stick with that knowing that she could do damage to her hand and arm she continued to do so.  She also was somewhat optimistic that she may be hired on with the new company and is optimistic about that.  Encouraged continued use of coping skills for stress and anxiety as she has had some anxiety related especially that feeling like the splint on her arm is giving her some claustrophobia. She does contract for safety having no thoughts of hurting herself or anyone else.  Interventions: Cognitive Behavioral Therapy  Diagnosis: Posttraumatic stress disorder  Plan: I will meet with the patient every week primarily  via care agility Progress: 35% Treatment plan: We will use cognitive behavioral therapy as well as elements of ego supportive therapy and dialectical behavior therapy with a goal of reducing the patient's anxiety and depression by at least 50% by November 27, 2023.  Goals for depression include the patient having less depression as evidenced by her report in session as well as PH-9 scores, to have improved mood and for her to return to a healthier level of functioning.  We will look to identify causes for depressed mood and learn coping skills for reduction of depression.  Interventions including using cognitive behavioral therapy to help her explore and replace unhealthy thoughts and behaviors contributing to depression, processed how she experiences depression daily, provide education about depression to help her identify its causes  and symptoms as well as sharing feelings of depression.  We will teach and encouraged the use of coping skills for management of depression.  Goals for anxiety are to improve her ability to manage anxiety symptoms, panic symptoms and better handle stress.  We will identify causes for anxiety and panic and explore ways to reduce it as well as resolving and processing core conflicts that are contributing to anxiety  and panic.  We will also work on managing worrisome thoughts contributing to feelings of anxiety and stress.  Interventions include providing education about anxiety to help her understand and identify its causes and symptoms, facilitate problem solution skills for reducing stress and anxiety as well as teach coping skills to manage anxiety such as grounding exercises, progressive muscle relaxation etc.  We will also use cognitive behavioral therapy to identify and change anxiety producing thoughts and behaviors as well as teach dialectical behavior therapy distress tolerance and mindfulness skills to help her learn anxiety management skills.  Goals for reducing trauma responses for PTSD include helping the patient recall the trauma without feeling overwhelmed or consumed with negative emotion as well as helping her to return to a pre trauma level of functioning.  Interventions include helping the patient identify the traumatic events impact her daily life as well as process feelings associated with the trauma including guilt and shame and sadness.  We will explore and as much detail as she is comfortable with sharing feelings regarding the trauma allowing for the gradual reduction of the emotional response through storytelling.  We will explore and reframe the patient's negative self talk associated with the trauma and identify negative self-defeating thoughts replacing it with positive thoughts and talk.  Lastly we will teach relaxation techniques to help her manage anxiety associated with the trauma.  Reviewed treatment goals and extended the goals with a new target date of May 25, 2024  Lorrene CHRISTELLA Hasten, [facility]                                  Lorrene CHRISTELLA Hasten, [facility]               Lorrene CHRISTELLA Hasten, [facility]               Lorrene CHRISTELLA Hasten, [facility]               Lorrene CHRISTELLA Hasten, [facility]               Lorrene CHRISTELLA Hasten,  [facility]               Lorrene CHRISTELLA Hasten, [facility]               Lorrene CHRISTELLA Hasten, [facility]               Lorrene CHRISTELLA Hasten, [facility]               Lorrene CHRISTELLA Hasten, [facility]               Lorrene CHRISTELLA Hasten, [facility]               Lorrene CHRISTELLA Hasten, [facility]               Lorrene CHRISTELLA Hasten, [facility]               Lorrene CHRISTELLA Hasten, [facility]               Lorrene CHRISTELLA Hasten,  [facility]

## 2024-05-19 ENCOUNTER — Ambulatory Visit: Admitting: Behavioral Health

## 2024-05-19 DIAGNOSIS — F4310 Post-traumatic stress disorder, unspecified: Secondary | ICD-10-CM

## 2024-05-19 NOTE — Progress Notes (Signed)
 [facility] Counselor/Therapist Progress Note  Patient ID: Tara Jones, MRN: [id number],    Date:05/19/24  Time Spent.  3:01 PM until 3:59 PM, 58 minutes.This session was held via video teletherapy. The patient consented to the video teletherapy and was located in her office during this session. She is aware it is the responsibility of the patient to secure confidentiality on her end of the session. The provider was in a private home office for the duration of this session.    Treatment Type: Individual Therapy  Reported Symptoms: Anxiety/stress, depression  Mental Status Exam: Appearance:  Casual and Well Groomed     Behavior: Appropriate  Motor: Normal  Speech/Language:  Normal Rate  Affect: Appropriate  Mood: normal  Thought process: normal  Thought content:   WNL  Sensory/Perceptual disturbances:   WNL  Orientation: oriented to person, place, time/date, situation, day of week, month of year, and year  Attention: Good  Concentration: Good  Memory: WNL  Fund of knowledge:  Good  Insight:   Good  Judgment:  Good  Impulse Control: Good   Risk Assessment: Danger to Self:  No Self-injurious Behavior: No Danger to Others: No Duty to Warn:no Physical Aggression / Violence:No  Access to Firearms a concern: No  Gang Involvement:No   Subjective: The patient is still in fairly significant pain and also has been itching a lot.  She had to go to urgent care to make sure there was nothing causing the itching other than just the recovery process and thankfully there was not.  She goes back to the doctor on July 30.  She moved back to her home 2 days ago saying she could not tolerate being in her mother's home anymore.  Her grandmother is staying with her and is helpful in a lot of ways but the patient says that she realizes now how much of living alone she has her own ways and that stuff when someone interrupts that routine even if it is beneficial.  She did say  that at times her grandmother and her effort to be helpful can be somewhat suffocating.  She has had a couple of a panic attacks which related to the pain, relationship with mother but also feeling trapped both with the wrap and cast on her arm but also being limited in what she can do.  She has been using some coping skills to at least minimize the impact of that anxiety and knows that she is getting better.  She has good movement in her fingers and is hopeful that there would be significant regeneration of the nerves.  There is still some instability with job situation.  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  There seems to be at least some level of feeling that she will be able to find something before those 2 things do not work out.  She also indicated that her friend that she knew from when she used to work at [employer] and has reached out to her and we talked about renewing that friendship and what it would look like for her. She does contract for safety having no thoughts of hurting herself or anyone else.  Interventions: Cognitive Behavioral Therapy  Diagnosis: Posttraumatic stress disorder  Plan: I will meet with the patient every week primarily  via care agility Progress: 35% Treatment plan: We  will use cognitive behavioral therapy as well as elements of ego supportive therapy and dialectical behavior therapy with a goal of reducing the patient's anxiety and depression by at least 50% by November 27, 2023.  Goals for depression include the patient having less depression as evidenced by her report in session as well as PH-9 scores, to have improved mood and for her to return to a healthier level of functioning.  We will look to identify causes for depressed mood and learn coping skills for reduction of depression.  Interventions including using cognitive behavioral therapy  to help her explore and replace unhealthy thoughts and behaviors contributing to depression, processed how she experiences depression daily, provide education about depression to help her identify its causes and symptoms as well as sharing feelings of depression.  We will teach and encouraged the use of coping skills for management of depression.  Goals for anxiety are to improve her ability to manage anxiety symptoms, panic symptoms and better handle stress.  We will identify causes for anxiety and panic and explore ways to reduce it as well as resolving and processing core conflicts that are contributing to anxiety and panic.  We will also work on managing worrisome thoughts contributing to feelings of anxiety and stress.  Interventions include providing education about anxiety to help her understand and identify its causes and symptoms, facilitate problem solution skills for reducing stress and anxiety as well as teach coping skills to manage anxiety such as grounding exercises, progressive muscle relaxation etc.  We will also use cognitive behavioral therapy to identify and change anxiety producing thoughts and behaviors as well as teach dialectical behavior therapy distress tolerance and mindfulness skills to help her learn anxiety management skills.  Goals for reducing trauma responses for PTSD include helping the patient recall the trauma without feeling overwhelmed or consumed with negative emotion as well as helping her to return to a pre trauma level of functioning.  Interventions include helping the patient identify the traumatic events impact her daily life as well as process feelings associated with the trauma including guilt and shame and sadness.  We will explore and as much detail as she is comfortable with sharing feelings regarding the trauma allowing for the gradual reduction of the emotional response through storytelling.  We will explore and reframe the patient's negative self talk  associated with the trauma and identify negative self-defeating thoughts replacing it with positive thoughts and talk.  Lastly we will teach relaxation techniques to help her manage anxiety associated with the trauma.  Reviewed treatment goals and extended the goals with a new target date of May 25, 2024  Lorrene CHRISTELLA Hasten, [facility]                                  Lorrene CHRISTELLA Hasten, [facility]               Lorrene CHRISTELLA Hasten, [facility]               Lorrene CHRISTELLA Hasten, [facility]               Lorrene CHRISTELLA Hasten, [facility]               Lorrene CHRISTELLA Hasten, [facility]               Lorrene CHRISTELLA Hasten, [facility]               Lorrene CHRISTELLA Hasten,  [facility]               Lorrene CHRISTELLA Hasten, [facility]               Lorrene CHRISTELLA Hasten, [facility]               Lorrene CHRISTELLA Hasten, [facility]               Lorrene CHRISTELLA Hasten, [facility]               Lorrene CHRISTELLA Hasten, [facility]               Lorrene CHRISTELLA Hasten, [facility]               Lorrene CHRISTELLA Hasten, [facility]               Lorrene CHRISTELLA Hasten, [facility]

## 2024-06-03 ENCOUNTER — Encounter: Payer: Self-pay | Admitting: Behavioral Health

## 2024-06-03 ENCOUNTER — Ambulatory Visit: Admitting: Behavioral Health

## 2024-06-03 DIAGNOSIS — F4310 Post-traumatic stress disorder, unspecified: Secondary | ICD-10-CM | POA: Diagnosis not present

## 2024-06-03 NOTE — Progress Notes (Signed)
 [facility] Counselor/Therapist Progress Note  Patient ID: Tara Jones, MRN: [id number],    Date:06/03/24  Time Spent.  8:01 AM until 9 AM, 59 minutes.This session was held via video teletherapy. The patient consented to the video teletherapy and was located in her office during this session. She is aware it is the responsibility of the patient to secure confidentiality on her end of the session. The provider was in a private home office for the duration of this session.    Treatment Type: Individual Therapy  Reported Symptoms: Anxiety/stress, depression  Mental Status Exam: Appearance:  Casual and Well Groomed     Behavior: Appropriate  Motor: Normal  Speech/Language:  Normal Rate  Affect: Appropriate  Mood: normal  Thought process: normal  Thought content:   WNL  Sensory/Perceptual disturbances:   WNL  Orientation: oriented to person, place, time/date, situation, day of week, month of year, and year  Attention: Good  Concentration: Good  Memory: WNL  Fund of knowledge:  Good  Insight:   Good  Judgment:  Good  Impulse Control: Good   Risk Assessment: Danger to Self:  No Self-injurious Behavior: No Danger to Others: No Duty to Warn:no Physical Aggression / Violence:No  Access to Firearms a concern: No  Gang Involvement:No   Subjective: For the most part the patient's arm continues to heal.  There is still discomfort she is able to wear a sleeve now instead of a bulky castor brace.  Sleep has gotten a little bit better.  The last couple of weeks have been complicated because her grandmother has been in her house trying to help her.  The patient said there have been things initially that the grandmother is able to help with but since that time her presence in the patient's life has been more of a frustration.  There have been a couple incidences in which the grandmother acted in a way which was very frustrating for the patient.  The patient said that she has  tried to be very calm and still got some very strong responses.  A couple of the situations got even bigger including the patient's mother.  The patient's mother did at least him some of the situations tried to calm the situation.  All of the situations have and somewhat triggered a trauma response from the patient based on the relationship with her biological mother and biological maternal grandmother over the years.  She said that she recognized that growing up she was told what she could do when she could do it in an extreme controlling manner and even though she wanted to run was not able to do so.  She says she is being harder herself because she still lying allow that type of thing that happened but she is more clearly now over the past few weeks how important it is when she is able to start to set firmer boundaries and not back down or run away from those things which trigger her trauma.  We begin to process how she could do that in a healthier way and will continue to do so in future sessions.  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  She is not sure what other options might be but says she has managed her anxiety associated with that. She does contract for safety having no thoughts of hurting herself or anyone else.  Interventions:  Cognitive Behavioral Therapy  Diagnosis: Posttraumatic stress disorder  Plan: I will meet with the patient every week primarily  via care agility Progress: 35% Treatment plan: We will use cognitive behavioral therapy as well as elements of ego supportive therapy and dialectical behavior therapy with a goal of reducing the patient's anxiety and depression by at least 50% by November 27, 2023.  Goals for depression include the patient having less depression as evidenced by her report in session as well as PH-9 scores, to have improved mood and for her to return to  a healthier level of functioning.  We will look to identify causes for depressed mood and learn coping skills for reduction of depression.  Interventions including using cognitive behavioral therapy to help her explore and replace unhealthy thoughts and behaviors contributing to depression, processed how she experiences depression daily, provide education about depression to help her identify its causes and symptoms as well as sharing feelings of depression.  We will teach and encouraged the use of coping skills for management of depression.  Goals for anxiety are to improve her ability to manage anxiety symptoms, panic symptoms and better handle stress.  We will identify causes for anxiety and panic and explore ways to reduce it as well as resolving and processing core conflicts that are contributing to anxiety and panic.  We will also work on managing worrisome thoughts contributing to feelings of anxiety and stress.  Interventions include providing education about anxiety to help her understand and identify its causes and symptoms, facilitate problem solution skills for reducing stress and anxiety as well as teach coping skills to manage anxiety such as grounding exercises, progressive muscle relaxation etc.  We will also use cognitive behavioral therapy to identify and change anxiety producing thoughts and behaviors as well as teach dialectical behavior therapy distress tolerance and mindfulness skills to help her learn anxiety management skills.  Goals for reducing trauma responses for PTSD include helping the patient recall the trauma without feeling overwhelmed or consumed with negative emotion as well as helping her to return to a pre trauma level of functioning.  Interventions include helping the patient identify the traumatic events impact her daily life as well as process feelings associated with the trauma including guilt and shame and sadness.  We will explore and as much detail as she is  comfortable with sharing feelings regarding the trauma allowing for the gradual reduction of the emotional response through storytelling.  We will explore and reframe the patient's negative self talk associated with the trauma and identify negative self-defeating thoughts replacing it with positive thoughts and talk.  Lastly we will teach relaxation techniques to help her manage anxiety associated with the trauma.  Reviewed treatment goals and extended the goals with a new target date of January 31 , 2026  Lorrene CHRISTELLA Hasten, [facility]                                  Lorrene CHRISTELLA Hasten, [facility]               Lorrene CHRISTELLA Hasten, [facility]               Lorrene CHRISTELLA Hasten, [facility]               Lorrene CHRISTELLA Hasten, [facility]               Lorrene CHRISTELLA Hasten, [facility]  Lorrene CHRISTELLA Hasten, [facility]               Lorrene CHRISTELLA Hasten, [facility]               Lorrene CHRISTELLA Hasten, [facility]               Lorrene CHRISTELLA Hasten, [facility]               Lorrene CHRISTELLA Hasten, [facility]               Lorrene CHRISTELLA Hasten, [facility]               Lorrene CHRISTELLA Hasten, [facility]               Lorrene CHRISTELLA Hasten, [facility]               Lorrene CHRISTELLA Hasten, [facility]               Lorrene CHRISTELLA Hasten, [facility]               Lorrene CHRISTELLA Hasten, [facility]

## 2024-06-09 ENCOUNTER — Encounter: Payer: Self-pay | Admitting: Behavioral Health

## 2024-06-09 ENCOUNTER — Ambulatory Visit: Admitting: Behavioral Health

## 2024-06-16 ENCOUNTER — Encounter: Payer: Self-pay | Admitting: Behavioral Health

## 2024-06-16 ENCOUNTER — Ambulatory Visit (INDEPENDENT_AMBULATORY_CARE_PROVIDER_SITE_OTHER): Admitting: Behavioral Health

## 2024-06-16 DIAGNOSIS — F4310 Post-traumatic stress disorder, unspecified: Secondary | ICD-10-CM

## 2024-06-16 NOTE — Progress Notes (Signed)
  Behavioral Health Counselor/Therapist Progress Note  Patient ID: Tara Jones, MRN: [id number],    Date:06/16/24  Time Spent.  4 PM until 4:59 PM 59 minutes.This session was held via video teletherapy. The patient consented to the video teletherapy and was located in her office during this session. She is aware it is the responsibility of the patient to secure confidentiality on her end of the session. The provider was in a private home office for the duration of this session.    Treatment Type: Individual Therapy  Reported Symptoms: Anxiety/stress, depression  Mental Status Exam: Appearance:  Casual and Well Groomed     Behavior: Appropriate  Motor: Normal  Speech/Language:  Normal Rate  Affect: Appropriate  Mood: normal  Thought process: normal  Thought content:   WNL  Sensory/Perceptual disturbances:   WNL  Orientation: oriented to person, place, time/date, situation, day of week, month of year, and year  Attention: Good  Concentration: Good  Memory: WNL  Fund of knowledge:  Good  Insight:   Good  Judgment:  Good  Impulse Control: Good   Risk Assessment: Danger to Self:  No Self-injurious Behavior: No Danger to Others: No Duty to Warn:no Physical Aggression / Violence:No  Access to Firearms a concern: No  Gang Involvement:No   Subjective: The announcements made today that 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  She notes that she is here being a little bit but is thankful to have work.  She had a close friend have decided to move to [facility] where her friends from and start a side business together which her  friend can work full-time that the patient can.  Being out for surgery has given her some time to do some soul searching.  She has always wanted to leave this area but sees this as a great Nativity because the company she will work for even though she can work from home he is in [place].  She is very close to her friend's family also and they need to live in that area.  She [place] a scare at the beach and that she had a severe asthma attack and had to be taken to the urgent care but she said for 3 to 4 days it was so exhausting that she did not think but sleep or lay around.  There was some strange family dynamics which just really from her that she needs to be in a different place.  She is going to start moving forward in that direction.  She feels that for the first time and maybe a while she has a plan to do things on her terms and although she knows will be some adjustment she feels good about that.  She also has connected with a group online and shares a lot of common interest with the patient and she has enjoyed their time together.  They are going to meet over a common interest at the end of September in [place].  She has not been sleeping well but did get her sleep medicine refilled today and is hopeful I will get her  back in balance.  She does contract for safety having no thoughts of hurting herself or anyone else.  Interventions: Cognitive Behavioral Therapy  Diagnosis: Posttraumatic stress disorder  Plan: I will meet with the patient every week primarily  via care agility Progress: 35% Treatment plan: We will use cognitive behavioral therapy as well as elements of ego supportive therapy and dialectical behavior therapy with a goal of reducing the patient's anxiety and depression by at least 50% by November 27, 2023.  Goals for depression include the patient having less depression as evidenced by her report in session as well as PH-9 scores, to have improved mood and for her to return to a  healthier level of functioning.  We will look to identify causes for depressed mood and learn coping skills for reduction of depression.  Interventions including using cognitive behavioral therapy to help her explore and replace unhealthy thoughts and behaviors contributing to depression, processed how she experiences depression daily, provide education about depression to help her identify its causes and symptoms as well as sharing feelings of depression.  We will teach and encouraged the use of coping skills for management of depression.  Goals for anxiety are to improve her ability to manage anxiety symptoms, panic symptoms and better handle stress.  We will identify causes for anxiety and panic and explore ways to reduce it as well as resolving and processing core conflicts that are contributing to anxiety and panic.  We will also work on managing worrisome thoughts contributing to feelings of anxiety and stress.  Interventions include providing education about anxiety to help her understand and identify its causes and symptoms, facilitate problem solution skills for reducing stress and anxiety as well as teach coping skills to manage anxiety such as grounding exercises, progressive muscle relaxation etc.  We will also use cognitive behavioral therapy to identify and change anxiety producing thoughts and behaviors as well as teach dialectical behavior therapy distress tolerance and mindfulness skills to help her learn anxiety management skills.  Goals for reducing trauma responses for PTSD include helping the patient recall the trauma without feeling overwhelmed or consumed with negative emotion as well as helping her to return to a pre trauma level of functioning.  Interventions include helping the patient identify the traumatic events impact her daily life as well as process feelings associated with the trauma including guilt and shame and sadness.  We will explore and as much detail as she is comfortable  with sharing feelings regarding the trauma allowing for the gradual reduction of the emotional response through storytelling.  We will explore and reframe the patient's negative self talk associated with the trauma and identify negative self-defeating thoughts replacing it with positive thoughts and talk.  Lastly we will teach relaxation techniques to help her manage anxiety associated with the trauma.  Reviewed treatment goals and extended the goals with a new target date of January 31 , 2026  Lorrene CHRISTELLA Hasten, [facility]                                  Lorrene CHRISTELLA Hasten, [facility]               Lorrene CHRISTELLA Hasten, [facility]               Lorrene CHRISTELLA Hasten, [facility]               Lorrene CHRISTELLA Hasten, [facility]  Lorrene CHRISTELLA Hasten, [facility]               Lorrene CHRISTELLA Hasten, [facility]               Lorrene CHRISTELLA Hasten, [facility]               Lorrene CHRISTELLA Hasten, [facility]               Lorrene CHRISTELLA Hasten, [facility]               Lorrene CHRISTELLA Hasten, [facility]               Lorrene CHRISTELLA Hasten, [facility]               Lorrene CHRISTELLA Hasten, [facility]               Lorrene CHRISTELLA Hasten, [facility]               Lorrene CHRISTELLA Hasten, [facility]               Lorrene CHRISTELLA Hasten, [facility]               Lorrene CHRISTELLA Hasten, [facility]               Lorrene CHRISTELLA Hasten, [facility]

## 2024-06-22 ENCOUNTER — Encounter: Payer: Self-pay | Admitting: Behavioral Health

## 2024-06-22 ENCOUNTER — Ambulatory Visit (INDEPENDENT_AMBULATORY_CARE_PROVIDER_SITE_OTHER): Admitting: Behavioral Health

## 2024-06-22 DIAGNOSIS — F4310 Post-traumatic stress disorder, unspecified: Secondary | ICD-10-CM

## 2024-06-22 NOTE — Progress Notes (Signed)
 [facility] Counselor/Therapist Progress Note  Patient ID: Tara Jones, MRN: [id number],    Date:06/22/24  Time Spent.  4 PM until 4:59 PM 59 minutes.This session was held via video teletherapy. The patient consented to the video teletherapy and was located in her office during this session. She is aware it is the responsibility of the patient to secure confidentiality on her end of the session. The provider was in a private home office for the duration of this session.    Treatment Type: Individual Therapy  Reported Symptoms: Anxiety/stress, depression  Mental Status Exam: Appearance:  Casual and Well Groomed     Behavior: Appropriate  Motor: Normal  Speech/Language:  Normal Rate  Affect: Appropriate  Mood: normal  Thought process: normal  Thought content:   WNL  Sensory/Perceptual disturbances:   WNL  Orientation: oriented to person, place, time/date, situation, day of week, month of year, and year  Attention: Good  Concentration: Good  Memory: WNL  Fund of knowledge:  Good  Insight:   Good  Judgment:  Good  Impulse Control: Good   Risk Assessment: Danger to Self:  No Self-injurious Behavior: No Danger to Others: No Duty to Warn:no Physical Aggression / Violence:No  Access to Firearms a concern: No  Gang Involvement:No   Subjective:  There have been some positives for the patient.  She had a meeting with her new boss and felt that it went fairly well.  She is feeling more valued on the team.  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  She said they were impressed with the work that she had done at the old company.  She also had a follow-up forearm and said they were pleased with the way it looks.  She is starting to be able to go without the brace periodically as long as she is in a place where she may not bump in the people or  fall.  She and her hand strength has doubled since the last follow-up.  Most of her stress is coming from her relationship with a friend of hers who they have been friends with for 20 years.  She said there relationship has been up and down and there is always been jealousy on the other friend's part when she told this friend about her new friends that she had met online she could tell that there was jealousy immediately.  She said she had done nothing to upset her and explained to them how she met them and that they were online friends but it did not seem to matter.  She feels they smooth things over a little bit but she said that over time she can tell that the friendship has weekend for various reasons and she knows there is a little that she can do about it.  She is and that friend's wedding in October so she will walk as far she can to make that relationship good.  She is working about 30 and 35 hours a week and said her new boss is understanding of that.  She is going into that new opportunity with a positive mindset.  She does contract for safety having no thoughts of hurting herself or anyone else.  Interventions: Cognitive Behavioral Therapy  Diagnosis: Posttraumatic stress disorder  Plan: I will meet with the patient every week primarily  via care agility  Progress: 35% Treatment plan: We will use cognitive behavioral therapy as well as elements of ego supportive therapy and dialectical behavior therapy with a goal of reducing the patient's anxiety and depression by at least 50% by November 27, 2023.  Goals for depression include the patient having less depression as evidenced by her report in session as well as PH-9 scores, to have improved mood and for her to return to a healthier level of functioning.  We will look to identify causes for depressed mood and learn coping skills for reduction of depression.  Interventions including using cognitive behavioral therapy to help her explore and  replace unhealthy thoughts and behaviors contributing to depression, processed how she experiences depression daily, provide education about depression to help her identify its causes and symptoms as well as sharing feelings of depression.  We will teach and encouraged the use of coping skills for management of depression.  Goals for anxiety are to improve her ability to manage anxiety symptoms, panic symptoms and better handle stress.  We will identify causes for anxiety and panic and explore ways to reduce it as well as resolving and processing core conflicts that are contributing to anxiety and panic.  We will also work on managing worrisome thoughts contributing to feelings of anxiety and stress.  Interventions include providing education about anxiety to help her understand and identify its causes and symptoms, facilitate problem solution skills for reducing stress and anxiety as well as teach coping skills to manage anxiety such as grounding exercises, progressive muscle relaxation etc.  We will also use cognitive behavioral therapy to identify and change anxiety producing thoughts and behaviors as well as teach dialectical behavior therapy distress tolerance and mindfulness skills to help her learn anxiety management skills.  Goals for reducing trauma responses for PTSD include helping the patient recall the trauma without feeling overwhelmed or consumed with negative emotion as well as helping her to return to a pre trauma level of functioning.  Interventions include helping the patient identify the traumatic events impact her daily life as well as process feelings associated with the trauma including guilt and shame and sadness.  We will explore and as much detail as she is comfortable with sharing feelings regarding the trauma allowing for the gradual reduction of the emotional response through storytelling.  We will explore and reframe the patient's negative self talk associated with the trauma and  identify negative self-defeating thoughts replacing it with positive thoughts and talk.  Lastly we will teach relaxation techniques to help her manage anxiety associated with the trauma.  Reviewed treatment goals and extended the goals with a new target date of January 31 , 2026  Lorrene CHRISTELLA Hasten, [facility]                                  Lorrene CHRISTELLA Hasten, [facility]               Lorrene CHRISTELLA Hasten, [facility]               Lorrene CHRISTELLA Hasten, [facility]               Lorrene CHRISTELLA Hasten, [facility]               Lorrene CHRISTELLA Hasten, [facility]               Lorrene CHRISTELLA Hasten, [facility]  Lorrene CHRISTELLA Hasten, [facility]               Lorrene CHRISTELLA Hasten, [facility]               Lorrene CHRISTELLA Hasten, [facility]               Lorrene CHRISTELLA Hasten, [facility]               Lorrene CHRISTELLA Hasten, [facility]               Lorrene CHRISTELLA Hasten, [facility]               Lorrene CHRISTELLA Hasten, [facility]               Lorrene CHRISTELLA Hasten, [facility]               Lorrene CHRISTELLA Hasten, [facility]               Lorrene CHRISTELLA Hasten, [facility]               Lorrene CHRISTELLA Hasten, [facility]               Lorrene CHRISTELLA Hasten, [facility]

## 2024-06-30 ENCOUNTER — Encounter: Payer: Self-pay | Admitting: Behavioral Health

## 2024-07-07 ENCOUNTER — Ambulatory Visit: Admitting: Behavioral Health

## 2024-07-07 ENCOUNTER — Encounter: Payer: Self-pay | Admitting: Behavioral Health

## 2024-07-07 ENCOUNTER — Ambulatory Visit (INDEPENDENT_AMBULATORY_CARE_PROVIDER_SITE_OTHER): Admitting: Behavioral Health

## 2024-07-07 DIAGNOSIS — F4310 Post-traumatic stress disorder, unspecified: Secondary | ICD-10-CM

## 2024-07-07 NOTE — Progress Notes (Signed)
 [facility] Counselor/Therapist Progress Note  Patient ID: Tara Jones, MRN: [id number],    Date:07/07/24  Time Spent.  4 PM until 4:59 PM 59 minutes.This session was held via video teletherapy. The patient consented to the video teletherapy and was located in her office during this session. She is aware it is the responsibility of the patient to secure confidentiality on her end of the session. The provider was in a private home office for the duration of this session.    Treatment Type: Individual Therapy  Reported Symptoms: Anxiety/stress, depression  Mental Status Exam: Appearance:  Casual and Well Groomed     Behavior: Appropriate  Motor: Normal  Speech/Language:  Normal Rate  Affect: Appropriate  Mood: normal  Thought process: normal  Thought content:   WNL  Sensory/Perceptual disturbances:   WNL  Orientation: oriented to person, place, time/date, situation, day of week, month of year, and year  Attention: Good  Concentration: Good  Memory: WNL  Fund of knowledge:  Good  Insight:   Good  Judgment:  Good  Impulse Control: Good   Risk Assessment: Danger to Self:  No Self-injurious Behavior: No Danger to Others: No Duty to Warn:no Physical Aggression / Violence:No  Access to Firearms a concern: No  Gang Involvement:No   Subjective: It has been a difficult week for the patient.  She has tried to work 30 to 35 hours a week but has not been as successful as she would like because she is in more pain and does not have as much functionality in her hand and knows that working is contributing to that.  She has done some work and submitted it but it was not received by her new employer in the form as still is looking for a new way for her to form at it.  I reminded her that they knew her reputation and requested that she work with them.  She is not on any pain medication so I encouraged her to reach out to her surgeon to see what she can give her.  Her surgeon  also told her that her psychiatrist could prescribe gabapentin so encouraged her to reach out to him also because she thinks she may need an increase or an addition to her ADD medication.  Also has been some discord in her new friend's group which the patient had nothing to do with directly but is trying to make better.  We talked about what she could and could not cannot control about that situation as well as the other things that are going on in her life.    She does contract for safety having no thoughts of hurting herself or anyone else.  Interventions: Cognitive Behavioral Therapy  Diagnosis: Posttraumatic stress disorder  Plan: I will meet with the patient every week primarily  via care agility Progress: 35% Treatment plan: We will use cognitive behavioral therapy as well as elements of ego supportive therapy and dialectical behavior therapy with a goal of reducing the patient's anxiety and depression by at least 50% by November 27, 2023.  Goals for depression include the patient having less depression as evidenced by her report in session as well as PH-9 scores, to have improved mood and for her to return to a healthier level of functioning.  We will look to identify causes for depressed mood and learn coping skills for reduction of depression.  Interventions including using cognitive behavioral therapy to help her explore and replace unhealthy thoughts and behaviors contributing to  depression, processed how she experiences depression daily, provide education about depression to help her identify its causes and symptoms as well as sharing feelings of depression.  We will teach and encouraged the use of coping skills for management of depression.  Goals for anxiety are to improve her ability to manage anxiety symptoms, panic symptoms and better handle stress.  We will identify causes for anxiety and panic and explore ways to reduce it as well as resolving and processing core conflicts that are  contributing to anxiety and panic.  We will also work on managing worrisome thoughts contributing to feelings of anxiety and stress.  Interventions include providing education about anxiety to help her understand and identify its causes and symptoms, facilitate problem solution skills for reducing stress and anxiety as well as teach coping skills to manage anxiety such as grounding exercises, progressive muscle relaxation etc.  We will also use cognitive behavioral therapy to identify and change anxiety producing thoughts and behaviors as well as teach dialectical behavior therapy distress tolerance and mindfulness skills to help her learn anxiety management skills.  Goals for reducing trauma responses for PTSD include helping the patient recall the trauma without feeling overwhelmed or consumed with negative emotion as well as helping her to return to a pre trauma level of functioning.  Interventions include helping the patient identify the traumatic events impact her daily life as well as process feelings associated with the trauma including guilt and shame and sadness.  We will explore and as much detail as she is comfortable with sharing feelings regarding the trauma allowing for the gradual reduction of the emotional response through storytelling.  We will explore and reframe the patient's negative self talk associated with the trauma and identify negative self-defeating thoughts replacing it with positive thoughts and talk.  Lastly we will teach relaxation techniques to help her manage anxiety associated with the trauma.  Reviewed treatment goals and extended the goals with a new target date of January 31 , 2026  Lorrene CHRISTELLA Hasten, [facility]                                  Lorrene CHRISTELLA Hasten, [facility]               Lorrene CHRISTELLA Hasten, [facility]               Lorrene CHRISTELLA Hasten, [facility]               Lorrene CHRISTELLA Hasten,  [facility]               Lorrene CHRISTELLA Hasten, [facility]               Lorrene CHRISTELLA Hasten, [facility]               Lorrene CHRISTELLA Hasten, [facility]               Lorrene CHRISTELLA Hasten, [facility]               Lorrene CHRISTELLA Hasten, [facility]               Lorrene CHRISTELLA Hasten, [facility]               Lorrene CHRISTELLA Hasten, [facility]               Lorrene CHRISTELLA Hasten, [facility]               Lorrene CHRISTELLA Hasten,  [facility]               Lorrene CHRISTELLA Hasten, [facility]               Lorrene CHRISTELLA Hasten, [facility]               Lorrene CHRISTELLA Hasten, [facility]               Lorrene CHRISTELLA Hasten, [facility]               Lorrene CHRISTELLA Hasten, [facility]               Lorrene CHRISTELLA Hasten, [facility]

## 2024-07-15 ENCOUNTER — Ambulatory Visit: Admitting: Behavioral Health

## 2024-07-15 ENCOUNTER — Encounter: Payer: Self-pay | Admitting: Behavioral Health

## 2024-07-15 DIAGNOSIS — F4310 Post-traumatic stress disorder, unspecified: Secondary | ICD-10-CM | POA: Diagnosis not present

## 2024-07-15 NOTE — Progress Notes (Signed)
 [facility] Counselor/Therapist Progress Note  Patient ID: KRISTEE ANGUS, MRN: [id number],    Date:07/15/24  Time Spent.  11:02 AM until 12 PM, 58 minutes.This session was held via video teletherapy. The patient consented to the video teletherapy and was located in her office during this session. She is aware it is the responsibility of the patient to secure confidentiality on her end of the session. The provider was in a private home office for the duration of this session.    Treatment Type: Individual Therapy  Reported Symptoms: Anxiety/stress, depression  Mental Status Exam: Appearance:  Casual and Well Groomed     Behavior: Appropriate  Motor: Normal  Speech/Language:  Normal Rate  Affect: Appropriate  Mood: normal  Thought process: normal  Thought content:   WNL  Sensory/Perceptual disturbances:   WNL  Orientation: oriented to person, place, time/date, situation, day of week, month of year, and year  Attention: Good  Concentration: Good  Memory: WNL  Fund of knowledge:  Good  Insight:   Good  Judgment:  Good  Impulse Control: Good   Risk Assessment: Danger to Self:  No Self-injurious Behavior: No Danger to Others: No Duty to Warn:no Physical Aggression / Violence:No  Access to Firearms a concern: No  Gang Involvement:No   Subjective: The patient's arm has felt better this week and has felt in a long time.  She did meet with the doctor and part of it appeared to be inflammation which they are addressing.  Because of that she has been more productive with her work and her new boss noticed how much improvement there had been since the patient was feeling better and appears to be very accommodating.  She went to her new office this week for the first time meeting her new boss and coworkers.  She described it as a mix of emotions.  She is thankful to have a job and the first impression seemed pretty good but she misses her old coworkers company involves  knowing it would be different.  She said the new company does seem to take the work that she is doing and even asked for her input on several things.  She is going to [facility] next weekend with her new friends saying that worked out most of the differences that were there and she is thankful for that.  She still has some anxiety about going and meeting these new people that she is only knowing all So would begin to look at the fact that her anxiety was rooted in how she sees herself..  We talked about how she sees yourself versus how she wants to be able to see her we talked about using cognitive reframing to challenge those negative self thoughts as well as looking at how her new friends see her and use that as an external to influence the internal.  She still is limiting how much news in media she is watching but was upset by some of the events of the recent couple of weeks. She does contract for safety having no thoughts of hurting herself or anyone else.  Interventions: Cognitive Behavioral Therapy  Diagnosis: Posttraumatic stress disorder  Plan: I will meet with the patient every week primarily  via care agility Progress: 40% Treatment plan: We will use cognitive behavioral therapy as well as elements of ego supportive therapy and dialectical behavior therapy with a goal of reducing the patient's anxiety and depression by at least 50% by November 27, 2023.  Goals for depression  include the patient having less depression as evidenced by her report in session as well as PH-9 scores, to have improved mood and for her to return to a healthier level of functioning.  We will look to identify causes for depressed mood and learn coping skills for reduction of depression.  Interventions including using cognitive behavioral therapy to help her explore and replace unhealthy thoughts and behaviors contributing to depression, processed how she experiences depression daily, provide education about depression to  help her identify its causes and symptoms as well as sharing feelings of depression.  We will teach and encouraged the use of coping skills for management of depression.  Goals for anxiety are to improve her ability to manage anxiety symptoms, panic symptoms and better handle stress.  We will identify causes for anxiety and panic and explore ways to reduce it as well as resolving and processing core conflicts that are contributing to anxiety and panic.  We will also work on managing worrisome thoughts contributing to feelings of anxiety and stress.  Interventions include providing education about anxiety to help her understand and identify its causes and symptoms, facilitate problem solution skills for reducing stress and anxiety as well as teach coping skills to manage anxiety such as grounding exercises, progressive muscle relaxation etc.  We will also use cognitive behavioral therapy to identify and change anxiety producing thoughts and behaviors as well as teach dialectical behavior therapy distress tolerance and mindfulness skills to help her learn anxiety management skills.  Goals for reducing trauma responses for PTSD include helping the patient recall the trauma without feeling overwhelmed or consumed with negative emotion as well as helping her to return to a pre trauma level of functioning.  Interventions include helping the patient identify the traumatic events impact her daily life as well as process feelings associated with the trauma including guilt and shame and sadness.  We will explore and as much detail as she is comfortable with sharing feelings regarding the trauma allowing for the gradual reduction of the emotional response through storytelling.  We will explore and reframe the patient's negative self talk associated with the trauma and identify negative self-defeating thoughts replacing it with positive thoughts and talk.  Lastly we will teach relaxation techniques to help her manage  anxiety associated with the trauma.  Reviewed treatment goals and extended the goals with a new target date of January 31 , 2026  Lorrene CHRISTELLA Hasten, [facility]                                  Lorrene CHRISTELLA Hasten, [facility]               Lorrene CHRISTELLA Hasten, [facility]               Lorrene CHRISTELLA Hasten, [facility]               Lorrene CHRISTELLA Hasten, [facility]               Lorrene CHRISTELLA Hasten, [facility]               Lorrene CHRISTELLA Hasten, [facility]               Lorrene CHRISTELLA Hasten, [facility]               Lorrene CHRISTELLA Hasten, [facility]               Lorrene CHRISTELLA Hasten, [facility]  Lorrene CHRISTELLA Hasten, [facility]               Lorrene CHRISTELLA Hasten, [facility]               Lorrene CHRISTELLA Hasten, [facility]               Lorrene CHRISTELLA Hasten, [facility]               Lorrene CHRISTELLA Hasten, [facility]               Lorrene CHRISTELLA Hasten, [facility]               Lorrene CHRISTELLA Hasten, [facility]               Lorrene CHRISTELLA Hasten, [facility]               Lorrene CHRISTELLA Hasten, [facility]               Lorrene CHRISTELLA Hasten, [facility]               Lorrene CHRISTELLA Hasten, [facility]

## 2024-07-21 ENCOUNTER — Ambulatory Visit (INDEPENDENT_AMBULATORY_CARE_PROVIDER_SITE_OTHER): Admitting: Behavioral Health

## 2024-07-21 DIAGNOSIS — F4310 Post-traumatic stress disorder, unspecified: Secondary | ICD-10-CM | POA: Diagnosis not present

## 2024-07-21 NOTE — Progress Notes (Signed)
 [facility] Counselor/Therapist Progress Note  Patient ID: Tara Jones, MRN: [id number],    Date:07/21/24  Time Spent.  4 PM until 4:58 PM, 58 minutes.This session was held via video teletherapy. The patient consented to the video teletherapy and was located in her office during this session. She is aware it is the responsibility of the patient to secure confidentiality on her end of the session. The provider was in a private home office for the duration of this session.    Treatment Type: Individual Therapy  Reported Symptoms: Anxiety/stress, depression  Mental Status Exam: Appearance:  Casual and Well Groomed     Behavior: Appropriate  Motor: Normal  Speech/Language:  Normal Rate  Affect: Appropriate  Mood: normal  Thought process: normal  Thought content:   WNL  Sensory/Perceptual disturbances:   WNL  Orientation: oriented to person, place, time/date, situation, day of week, month of year, and year  Attention: Good  Concentration: Good  Memory: WNL  Fund of knowledge:  Good  Insight:   Good  Judgment:  Good  Impulse Control: Good   Risk Assessment: Danger to Self:  No Self-injurious Behavior: No Danger to Others: No Duty to Warn:no Physical Aggression / Violence:No  Access to Firearms a concern: No  Gang Involvement:No   Subjective: The patient met with the surgeon this week and even though she had been in significant pain and he feels that her arm is progressing well.  He thinks most of it may be related to inflammation so he is starting her on a steroid to see how that does.  She is leaving tonight to drive to [place] with some of her new friends and be some other friends there.  There are some anxiety about going as she has never met a lot of them or most of them face-to-face but has had multiple chats with him over the past few months.  We processed some of her anxiety and some of the personality differences among friends and the importance of  emotional regulation and using coping skills this weekend.  She has some very clear boundaries with her family this week which she is proud of and is proud of the fact that she is taking a risk to go to [place] with people she does not know as well as she would like to.  She also recognizes especially with work she is using avoidance even though she is capable of doing the work and seems to have connected fairly well with the new boss.  She recognizes that the pattern that she has had since she was a child.  We will look more at the avoidance in the following sessions.  She does contract for safety having no thoughts of hurting herself or anyone else.  Interventions: Cognitive Behavioral Therapy  Diagnosis: Posttraumatic stress disorder  Plan: I will meet with the patient every week primarily  via care agility Progress: 40% Treatment plan: We will use cognitive behavioral therapy as well as elements of ego supportive therapy and dialectical behavior therapy with a goal of reducing the patient's anxiety and depression by at least 50% by November 27, 2023.  Goals for depression include the patient having less depression as evidenced by her report in session as well as PH-9 scores, to have improved mood and for her to return to a healthier level of functioning.  We will look to identify causes for depressed mood and learn coping skills for reduction of depression.  Interventions including using cognitive behavioral therapy  to help her explore and replace unhealthy thoughts and behaviors contributing to depression, processed how she experiences depression daily, provide education about depression to help her identify its causes and symptoms as well as sharing feelings of depression.  We will teach and encouraged the use of coping skills for management of depression.  Goals for anxiety are to improve her ability to manage anxiety symptoms, panic symptoms and better handle stress.  We will identify causes for  anxiety and panic and explore ways to reduce it as well as resolving and processing core conflicts that are contributing to anxiety and panic.  We will also work on managing worrisome thoughts contributing to feelings of anxiety and stress.  Interventions include providing education about anxiety to help her understand and identify its causes and symptoms, facilitate problem solution skills for reducing stress and anxiety as well as teach coping skills to manage anxiety such as grounding exercises, progressive muscle relaxation etc.  We will also use cognitive behavioral therapy to identify and change anxiety producing thoughts and behaviors as well as teach dialectical behavior therapy distress tolerance and mindfulness skills to help her learn anxiety management skills.  Goals for reducing trauma responses for PTSD include helping the patient recall the trauma without feeling overwhelmed or consumed with negative emotion as well as helping her to return to a pre trauma level of functioning.  Interventions include helping the patient identify the traumatic events impact her daily life as well as process feelings associated with the trauma including guilt and shame and sadness.  We will explore and as much detail as she is comfortable with sharing feelings regarding the trauma allowing for the gradual reduction of the emotional response through storytelling.  We will explore and reframe the patient's negative self talk associated with the trauma and identify negative self-defeating thoughts replacing it with positive thoughts and talk.  Lastly we will teach relaxation techniques to help her manage anxiety associated with the trauma.  Reviewed treatment goals and extended the goals with a new target date of January 31 , 2026  Lorrene CHRISTELLA Hasten, [facility]                                  Lorrene CHRISTELLA Hasten, [facility]               Lorrene CHRISTELLA Hasten,  [facility]               Lorrene CHRISTELLA Hasten, [facility]               Lorrene CHRISTELLA Hasten, [facility]               Lorrene CHRISTELLA Hasten, [facility]               Lorrene CHRISTELLA Hasten, [facility]               Lorrene CHRISTELLA Hasten, [facility]               Lorrene CHRISTELLA Hasten, [facility]               Lorrene CHRISTELLA Hasten, [facility]               Lorrene CHRISTELLA Hasten, [facility]               Lorrene CHRISTELLA Hasten, [facility]               Lorrene CHRISTELLA Hasten, [facility]  Lorrene CHRISTELLA Hasten, [facility]               Lorrene CHRISTELLA Hasten, [facility]               Lorrene CHRISTELLA Hasten, [facility]               Lorrene CHRISTELLA Hasten, [facility]               Lorrene CHRISTELLA Hasten, [facility]               Lorrene CHRISTELLA Hasten, [facility]               Lorrene CHRISTELLA Hasten, [facility]               Lorrene CHRISTELLA Hasten, [facility]               Lorrene CHRISTELLA Hasten, [facility]

## 2024-07-28 ENCOUNTER — Encounter: Payer: Self-pay | Admitting: Behavioral Health

## 2024-07-28 ENCOUNTER — Ambulatory Visit: Admitting: Behavioral Health

## 2024-07-28 DIAGNOSIS — F4310 Post-traumatic stress disorder, unspecified: Secondary | ICD-10-CM | POA: Diagnosis not present

## 2024-07-28 NOTE — Progress Notes (Signed)
 [facility] Counselor/Therapist Progress Note  Patient ID: Tara Jones, MRN: [id number],    Date:07/28/24  Time Spent.  4 PM until 4:58 PM, 58 minutes.This session was held via video teletherapy. The patient consented to the video teletherapy and was located in her office during this session. She is aware it is the responsibility of the patient to secure confidentiality on her end of the session. The provider was in a private home office for the duration of this session.    Treatment Type: Individual Therapy  Reported Symptoms: Anxiety/stress, depression  Mental Status Exam: Appearance:  Casual and Well Groomed     Behavior: Appropriate  Motor: Normal  Speech/Language:  Normal Rate  Affect: Appropriate  Mood: normal  Thought process: normal  Thought content:   WNL  Sensory/Perceptual disturbances:   WNL  Orientation: oriented to person, place, time/date, situation, day of week, month of year, and year  Attention: Good  Concentration: Good  Memory: WNL  Fund of knowledge:  Good  Insight:   Good  Judgment:  Good  Impulse Control: Good   Risk Assessment: Danger to Self:  No Self-injurious Behavior: No Danger to Others: No Duty to Warn:no Physical Aggression / Violence:No  Access to Firearms a concern: No  Gang Involvement:No   Subjective: The patient's trip to [place] went well for the most part.  The friends who she had spent a lot of time with either talking to on the phone or through social media all gathered together.  She rode to [place] with one of them and said that was a good trip.  The 2 that she had become closest to her there and she enjoyed time with them.  There were a couple of women in the group whom she said were devices so they set healthy boundaries with them.  She said has developed a stronger friendship with including 2 in particular.  It is difficult because they live in different states 3 of which were in [place] , 1 in [place]  and 1  in [place]  but she will stay in touch with him through other means and they talk some of them daily.  She said it help remind her that she can maintain friendships and that people do value and appreciate her for who she is and I encouraged her to focus on that.  She had been struggling with work even understanding some of her when she knows that she does her job well and also being motivated to do that.  She met with her psychiatrist and they changed her ADD medication a little bit with hopes that will increase focus and she will start that tomorrow.  She also had not been taking her prednisone  because she forgot so she will start back on that tomorrow.  She had not been eating well either so she is going to the grocery store tonight to buy some things.  In addition she had been making some impulsive food decisions.  We talked about being more mindful of and she appears to be and would look to some things that she can do to get things back on track with work and eating healthier.  If she also did speak to her mother but continues to set healthy boundaries with her. She does contract for safety having no thoughts of hurting herself or anyone else.  Interventions: Cognitive Behavioral Therapy  Diagnosis: Posttraumatic stress disorder  Plan: I will meet with the patient every week primarily  via care agility Progress: 40%  Treatment plan: We will use cognitive behavioral therapy as well as elements of ego supportive therapy and dialectical behavior therapy with a goal of reducing the patient's anxiety and depression by at least 50% by November 27, 2023.  Goals for depression include the patient having less depression as evidenced by her report in session as well as PH-9 scores, to have improved mood and for her to return to a healthier level of functioning.  We will look to identify causes for depressed mood and learn coping skills for reduction of depression.  Interventions including using cognitive  behavioral therapy to help her explore and replace unhealthy thoughts and behaviors contributing to depression, processed how she experiences depression daily, provide education about depression to help her identify its causes and symptoms as well as sharing feelings of depression.  We will teach and encouraged the use of coping skills for management of depression.  Goals for anxiety are to improve her ability to manage anxiety symptoms, panic symptoms and better handle stress.  We will identify causes for anxiety and panic and explore ways to reduce it as well as resolving and processing core conflicts that are contributing to anxiety and panic.  We will also work on managing worrisome thoughts contributing to feelings of anxiety and stress.  Interventions include providing education about anxiety to help her understand and identify its causes and symptoms, facilitate problem solution skills for reducing stress and anxiety as well as teach coping skills to manage anxiety such as grounding exercises, progressive muscle relaxation etc.  We will also use cognitive behavioral therapy to identify and change anxiety producing thoughts and behaviors as well as teach dialectical behavior therapy distress tolerance and mindfulness skills to help her learn anxiety management skills.  Goals for reducing trauma responses for PTSD include helping the patient recall the trauma without feeling overwhelmed or consumed with negative emotion as well as helping her to return to a pre trauma level of functioning.  Interventions include helping the patient identify the traumatic events impact her daily life as well as process feelings associated with the trauma including guilt and shame and sadness.  We will explore and as much detail as she is comfortable with sharing feelings regarding the trauma allowing for the gradual reduction of the emotional response through storytelling.  We will explore and reframe the patient's negative  self talk associated with the trauma and identify negative self-defeating thoughts replacing it with positive thoughts and talk.  Lastly we will teach relaxation techniques to help her manage anxiety associated with the trauma.  Reviewed treatment goals and extended the goals with a new target date of January 31 , 2026  Lorrene CHRISTELLA Hasten, [facility]                                  Lorrene CHRISTELLA Hasten, [facility]               Lorrene CHRISTELLA Hasten, [facility]               Lorrene CHRISTELLA Hasten, [facility]               Lorrene CHRISTELLA Hasten, [facility]               Lorrene CHRISTELLA Hasten, [facility]               Lorrene CHRISTELLA Hasten, [facility]  Lorrene CHRISTELLA Hasten, [facility]               Lorrene CHRISTELLA Hasten, [facility]               Lorrene CHRISTELLA Hasten, [facility]               Lorrene CHRISTELLA Hasten, [facility]               Lorrene CHRISTELLA Hasten, [facility]               Lorrene CHRISTELLA Hasten, [facility]               Lorrene CHRISTELLA Hasten, [facility]               Lorrene CHRISTELLA Hasten, [facility]               Lorrene CHRISTELLA Hasten, [facility]               Lorrene CHRISTELLA Hasten, [facility]               Lorrene CHRISTELLA Hasten, [facility]               Lorrene CHRISTELLA Hasten, [facility]               Lorrene CHRISTELLA Hasten, [facility]               Lorrene CHRISTELLA Hasten, [facility]               Lorrene CHRISTELLA Hasten, [facility]               Lorrene CHRISTELLA Hasten, [facility]

## 2024-08-03 ENCOUNTER — Encounter: Payer: Self-pay | Admitting: Behavioral Health

## 2024-08-03 ENCOUNTER — Ambulatory Visit (INDEPENDENT_AMBULATORY_CARE_PROVIDER_SITE_OTHER): Admitting: Behavioral Health

## 2024-08-03 DIAGNOSIS — F4310 Post-traumatic stress disorder, unspecified: Secondary | ICD-10-CM | POA: Diagnosis not present

## 2024-08-03 DIAGNOSIS — F902 Attention-deficit hyperactivity disorder, combined type: Secondary | ICD-10-CM

## 2024-08-03 NOTE — Progress Notes (Signed)
 [facility] Counselor/Therapist Progress Note  Patient ID: Tara Jones, MRN: [id number],    Date:08/03/24  Time Spent.  1:04 PM until 1:59 PM, 55 minutes.This session was held via video teletherapy. The patient consented to the video teletherapy and was located in her office during this session. She is aware it is the responsibility of the patient to secure confidentiality on her end of the session. The provider was in a private home office for the duration of this session.    Treatment Type: Individual Therapy  Reported Symptoms: Anxiety/stress, depression  Mental Status Exam: Appearance:  Casual and Well Groomed     Behavior: Appropriate  Motor: Normal  Speech/Language:  Normal Rate  Affect: Appropriate  Mood: normal  Thought process: normal  Thought content:   WNL  Sensory/Perceptual disturbances:   WNL  Orientation: oriented to person, place, time/date, situation, day of week, month of year, and year  Attention: Good  Concentration: Good  Memory: WNL  Fund of knowledge:  Good  Insight:   Good  Judgment:  Good  Impulse Control: Good   Risk Assessment: Danger to Self:  No Self-injurious Behavior: No Danger to Others: No Duty to Warn:no Physical Aggression / Violence:No  Access to Firearms a concern: No  Gang Involvement:No   Subjective: The patient has started a booster medication for ADD and says that at least over the last 4 days it has taken the vibration out of her brain.  She has not noticed significant improvement in concentration but is hopeful.  She remembers the fatigue that comes along with starting a new ADD medication and she is in that phase.  She has still had difficulty concentrating at work and has not gotten a lot done but plans to be active and that after her session to get herself in a better place.  She is leaving tomorrow to go to her friend's wedding in [place]  where she is the maid of honor we processed her feelings about  that.  She recognize that because she went too far without adjusting medication her impulsivity has led to some financial stress as well as she was not eating well.  She has backed off some of the impulsive decisions such as eating out and has bought groceries so she is eating healthier.  Sleep is still not where she wants it to be.  She has taken a close look at her budget to see what she can do to make that better.  She recognizes that her speech is slowing down so she knows that is a positive sign.  She says she still has periods where she describes it as disassociating but basically meaning she zones out.  There is nothing particular that she zones out about.  Most of the time she recognizes that she is zoning out but cannot always bring herself back in and so we talked about some ways in addition to the addition of medication that she can do that.  She continues to strengthen her relationship with her new friends and said for the most part that still going well and she recognizes how much she appreciates these friendships as opposed to some of the ones that she has had in the past.  She continues to set good boundaries with her mother.  Her neighbor is watching her animals where in the past she would have asked her mom to do or boarded them.  She does contract for safety having no thoughts of hurting herself or anyone else.  Interventions: Cognitive Behavioral Therapy  Diagnosis: Posttraumatic stress disorder  Plan: I will meet with the patient every week primarily  via care agility Progress: 40% Treatment plan: We will use cognitive behavioral therapy as well as elements of ego supportive therapy and dialectical behavior therapy with a goal of reducing the patient's anxiety and depression by at least 50% by November 27, 2023.  Goals for depression include the patient having less depression as evidenced by her report in session as well as PH-9 scores, to have improved mood and for her to return  to a healthier level of functioning.  We will look to identify causes for depressed mood and learn coping skills for reduction of depression.  Interventions including using cognitive behavioral therapy to help her explore and replace unhealthy thoughts and behaviors contributing to depression, processed how she experiences depression daily, provide education about depression to help her identify its causes and symptoms as well as sharing feelings of depression.  We will teach and encouraged the use of coping skills for management of depression.  Goals for anxiety are to improve her ability to manage anxiety symptoms, panic symptoms and better handle stress.  We will identify causes for anxiety and panic and explore ways to reduce it as well as resolving and processing core conflicts that are contributing to anxiety and panic.  We will also work on managing worrisome thoughts contributing to feelings of anxiety and stress.  Interventions include providing education about anxiety to help her understand and identify its causes and symptoms, facilitate problem solution skills for reducing stress and anxiety as well as teach coping skills to manage anxiety such as grounding exercises, progressive muscle relaxation etc.  We will also use cognitive behavioral therapy to identify and change anxiety producing thoughts and behaviors as well as teach dialectical behavior therapy distress tolerance and mindfulness skills to help her learn anxiety management skills.  Goals for reducing trauma responses for PTSD include helping the patient recall the trauma without feeling overwhelmed or consumed with negative emotion as well as helping her to return to a pre trauma level of functioning.  Interventions include helping the patient identify the traumatic events impact her daily life as well as process feelings associated with the trauma including guilt and shame and sadness.  We will explore and as much detail as she is  comfortable with sharing feelings regarding the trauma allowing for the gradual reduction of the emotional response through storytelling.  We will explore and reframe the patient's negative self talk associated with the trauma and identify negative self-defeating thoughts replacing it with positive thoughts and talk.  Lastly we will teach relaxation techniques to help her manage anxiety associated with the trauma.  Reviewed treatment goals and extended the goals with a new target date of January 31 , 2026  Lorrene CHRISTELLA Hasten, [facility]                                  Lorrene CHRISTELLA Hasten, [facility]               Lorrene CHRISTELLA Hasten, [facility]               Lorrene CHRISTELLA Hasten, [facility]               Lorrene CHRISTELLA Hasten, [facility]               Lorrene CHRISTELLA Hasten, [facility]  Lorrene CHRISTELLA Hasten, [facility]               Lorrene CHRISTELLA Hasten, [facility]               Lorrene CHRISTELLA Hasten, [facility]               Lorrene CHRISTELLA Hasten, [facility]               Lorrene CHRISTELLA Hasten, [facility]               Lorrene CHRISTELLA Hasten, [facility]               Lorrene CHRISTELLA Hasten, [facility]               Lorrene CHRISTELLA Hasten, [facility]               Lorrene CHRISTELLA Hasten, [facility]               Lorrene CHRISTELLA Hasten, [facility]               Lorrene CHRISTELLA Hasten, [facility]               Lorrene CHRISTELLA Hasten, [facility]               Lorrene CHRISTELLA Hasten, [facility]               Lorrene CHRISTELLA Hasten, [facility]               Lorrene CHRISTELLA Hasten, [facility]               Lorrene CHRISTELLA Hasten, [facility]               Lorrene CHRISTELLA Hasten, [facility]               Lorrene CHRISTELLA Hasten, [facility]

## 2024-08-10 ENCOUNTER — Ambulatory Visit (INDEPENDENT_AMBULATORY_CARE_PROVIDER_SITE_OTHER): Admitting: Behavioral Health

## 2024-08-10 ENCOUNTER — Encounter: Payer: Self-pay | Admitting: Behavioral Health

## 2024-08-10 DIAGNOSIS — F4310 Post-traumatic stress disorder, unspecified: Secondary | ICD-10-CM | POA: Diagnosis not present

## 2024-08-10 NOTE — Progress Notes (Signed)
 [facility] Counselor/Therapist Progress Note  Patient ID: AMANY RANDO, MRN: [id number],    Date:08/10/24  Time Spent.  2:03 to 3 PM until 2:59 PM, 57 minutes.This session was held via video teletherapy. The patient consented to the video teletherapy and was located in her office during this session. She is aware it is the responsibility of the patient to secure confidentiality on her end of the session. The provider was in a private home office for the duration of this session.    Treatment Type: Individual Therapy  Reported Symptoms: Anxiety/stress, depression  Mental Status Exam: Appearance:  Casual and Well Groomed     Behavior: Appropriate  Motor: Normal  Speech/Language:  Normal Rate  Affect: Appropriate  Mood: normal  Thought process: normal  Thought content:   WNL  Sensory/Perceptual disturbances:   WNL  Orientation: oriented to person, place, time/date, situation, day of week, month of year, and year  Attention: Good  Concentration: Good  Memory: WNL  Fund of knowledge:  Good  Insight:   Good  Judgment:  Good  Impulse Control: Good   Risk Assessment: Danger to Self:  No Self-injurious Behavior: No Danger to Others: No Duty to Warn:no Physical Aggression / Violence:No  Access to Firearms a concern: No  Gang Involvement:No   Subjective: The patient said for the most part the wedding went well last week and with the exception of the very end.  She said she had to knock on the door of the provide for something but yelled angrily at her for no reason.  She said it was in front of everyone and she was totally embarrassed and sat down quietly until she could leave.  She has not spoken to the bride since then.  She says her to the pattern of that throughout their friendship but this was extremely embarrassing and hurtful.  We talked about how she could start to stand up for herself in situations like that with her in the moment or later.  There was also  a misunderstanding and the new friends group that she is part of.  She said she was triggered by certain descriptor which was used repeatedly.  It was initially a joke but felt like it always directed at her and they do not necessarily I did not know necessarily why that triggered her.  She said that she just wants [place] for a little bit as a way to not say anything that she regretted and that went south.  She feels that she is in a better place with one of the 2 new friends but the other 1 did not seem to understand how she can best address that situation to begin restoration of that relationship.  She does contract for safety having no thoughts of hurting herself or anyone else.  Interventions: Cognitive Behavioral Therapy  Diagnosis: Posttraumatic stress disorder  Plan: I will meet with the patient every week primarily  via care agility Progress: 40% Treatment plan: We will use cognitive behavioral therapy as well as elements of ego supportive therapy and dialectical behavior therapy with a goal of reducing the patient's anxiety and depression by at least 50% by November 27, 2023.  Goals for depression include the patient having less depression as evidenced by her report in session as well as PH-9 scores, to have improved mood and for her to return to a healthier level of functioning.  We will look to identify causes for depressed mood and learn coping skills for reduction of  depression.  Interventions including using cognitive behavioral therapy to help her explore and replace unhealthy thoughts and behaviors contributing to depression, processed how she experiences depression daily, provide education about depression to help her identify its causes and symptoms as well as sharing feelings of depression.  We will teach and encouraged the use of coping skills for management of depression.  Goals for anxiety are to improve her ability to manage anxiety symptoms, panic symptoms and better handle  stress.  We will identify causes for anxiety and panic and explore ways to reduce it as well as resolving and processing core conflicts that are contributing to anxiety and panic.  We will also work on managing worrisome thoughts contributing to feelings of anxiety and stress.  Interventions include providing education about anxiety to help her understand and identify its causes and symptoms, facilitate problem solution skills for reducing stress and anxiety as well as teach coping skills to manage anxiety such as grounding exercises, progressive muscle relaxation etc.  We will also use cognitive behavioral therapy to identify and change anxiety producing thoughts and behaviors as well as teach dialectical behavior therapy distress tolerance and mindfulness skills to help her learn anxiety management skills.  Goals for reducing trauma responses for PTSD include helping the patient recall the trauma without feeling overwhelmed or consumed with negative emotion as well as helping her to return to a pre trauma level of functioning.  Interventions include helping the patient identify the traumatic events impact her daily life as well as process feelings associated with the trauma including guilt and shame and sadness.  We will explore and as much detail as she is comfortable with sharing feelings regarding the trauma allowing for the gradual reduction of the emotional response through storytelling.  We will explore and reframe the patient's negative self talk associated with the trauma and identify negative self-defeating thoughts replacing it with positive thoughts and talk.  Lastly we will teach relaxation techniques to help her manage anxiety associated with the trauma.  Reviewed treatment goals and extended the goals with a new target date of January 31 , 2026  Lorrene CHRISTELLA Hasten, [facility]                                  Lorrene CHRISTELLA Hasten, [facility]               Lorrene CHRISTELLA Hasten, [facility]               Lorrene CHRISTELLA Hasten, [facility]               Lorrene CHRISTELLA Hasten, [facility]               Lorrene CHRISTELLA Hasten, [facility]               Lorrene CHRISTELLA Hasten, [facility]               Lorrene CHRISTELLA Hasten, [facility]               Lorrene CHRISTELLA Hasten, [facility]               Lorrene CHRISTELLA Hasten, [facility]               Lorrene CHRISTELLA Hasten, [facility]               Lorrene CHRISTELLA Hasten, [facility]               Lorrene  CHRISTELLA Hasten, [facility]               Lorrene CHRISTELLA Hasten, [facility]               Lorrene CHRISTELLA Hasten, [facility]               Lorrene CHRISTELLA Hasten, [facility]               Lorrene CHRISTELLA Hasten, [facility]               Lorrene CHRISTELLA Hasten, [facility]               Lorrene CHRISTELLA Hasten, [facility]               Lorrene CHRISTELLA Hasten, [facility]               Lorrene CHRISTELLA Hasten, [facility]               Lorrene CHRISTELLA Hasten, [facility]               Lorrene CHRISTELLA Hasten, [facility]               Lorrene CHRISTELLA Hasten, [facility]               Lorrene CHRISTELLA Hasten, [facility]

## 2024-08-18 ENCOUNTER — Ambulatory Visit: Admitting: Behavioral Health

## 2024-08-18 ENCOUNTER — Encounter: Payer: Self-pay | Admitting: Behavioral Health

## 2024-08-18 DIAGNOSIS — F4310 Post-traumatic stress disorder, unspecified: Secondary | ICD-10-CM

## 2024-08-18 NOTE — Progress Notes (Signed)
 [facility] Counselor/Therapist Progress Note  Patient ID: Tara Jones, MRN: [id number],    Date:08/18/24  Time Spent.  4 PM until 4:59 PM, 59 minutes.This session was held via video teletherapy. The patient consented to the video teletherapy and was located in her home during this session. She is aware it is the responsibility of the patient to secure confidentiality on her end of the session. The provider was in a private home office for the duration of this session.    Treatment Type: Individual Therapy  Reported Symptoms: Anxiety/stress, depression  Mental Status Exam: Appearance:  Casual and Well Groomed     Behavior: Appropriate  Motor: Normal  Speech/Language:  Normal Rate  Affect: Appropriate  Mood: normal  Thought process: normal  Thought content:   WNL  Sensory/Perceptual disturbances:   WNL  Orientation: oriented to person, place, time/date, situation, day of week, month of year, and year  Attention: Good  Concentration: Good  Memory: WNL  Fund of knowledge:  Good  Insight:   Good  Judgment:  Good  Impulse Control: Good   Risk Assessment: Danger to Self:  No Self-injurious Behavior: No Danger to Others: No Duty to Warn:no Physical Aggression / Violence:No  Access to Firearms a concern: No  Gang Involvement:No   Subjective: It has been a difficult week for the patient.  The friendship that had some conflict just prior to her previous session has not gotten any better.  She says that she knows that she did not do anything wrong and does not understand the friend's response but is that she can to explain and make things better and is now excepting that it may not change.  Others in her friend group have stepped up and appeared to understand.  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  She said it was nothing that they did but more because  cutting move which made no sense only 2 months after they were absorbed into the company.  She does get payment and severance through January 2026 and a lump some.  She has a plan for spreading that out financially but will be starting to actively look for other opportunities.  She has reached out to her previous boss to see if he has any thoughts or leads.  She said she was not surprised but was disappointed but knows that she had no control over the situation.  She continues to set good boundaries with her parents as well as others and says that she realizes that the minute she relaxes the boundaries, though she set boundaries with structure to return to the previous behaviors so she sees the need for continued steady boundaries.  She also has been considering another dog.  One of her neighbors is fostering a dog and the patient and her dog are both met this dog.  She would still have to go through a animal foster program but thinks she would have a good chance to be recommended based on the history with her neighbor and her neighbor knowing how well she cared for her animals.  She did get some good news that the strength in her hand is almost quadrupled since July.  It is still considered minimal but she is thankful for the progress.  There is still what she considers as electrical impulses on a regular basis so she is not sure  what steps they will have to take with that yet.  Continues in physical therapy. She does contract for safety having no thoughts of hurting herself or anyone else.  Interventions: Cognitive Behavioral Therapy  Diagnosis: Posttraumatic stress disorder  Plan: I will meet with the patient every week primarily  via care agility Progress: 40% Treatment plan: We will use cognitive behavioral therapy as well as elements of ego supportive therapy and dialectical behavior therapy with a goal of reducing the patient's anxiety and depression by at least 50% by November 27, 2023.  Goals  for depression include the patient having less depression as evidenced by her report in session as well as PH-9 scores, to have improved mood and for her to return to a healthier level of functioning.  We will look to identify causes for depressed mood and learn coping skills for reduction of depression.  Interventions including using cognitive behavioral therapy to help her explore and replace unhealthy thoughts and behaviors contributing to depression, processed how she experiences depression daily, provide education about depression to help her identify its causes and symptoms as well as sharing feelings of depression.  We will teach and encouraged the use of coping skills for management of depression.  Goals for anxiety are to improve her ability to manage anxiety symptoms, panic symptoms and better handle stress.  We will identify causes for anxiety and panic and explore ways to reduce it as well as resolving and processing core conflicts that are contributing to anxiety and panic.  We will also work on managing worrisome thoughts contributing to feelings of anxiety and stress.  Interventions include providing education about anxiety to help her understand and identify its causes and symptoms, facilitate problem solution skills for reducing stress and anxiety as well as teach coping skills to manage anxiety such as grounding exercises, progressive muscle relaxation etc.  We will also use cognitive behavioral therapy to identify and change anxiety producing thoughts and behaviors as well as teach dialectical behavior therapy distress tolerance and mindfulness skills to help her learn anxiety management skills.  Goals for reducing trauma responses for PTSD include helping the patient recall the trauma without feeling overwhelmed or consumed with negative emotion as well as helping her to return to a pre trauma level of functioning.  Interventions include helping the patient identify the traumatic events  impact her daily life as well as process feelings associated with the trauma including guilt and shame and sadness.  We will explore and as much detail as she is comfortable with sharing feelings regarding the trauma allowing for the gradual reduction of the emotional response through storytelling.  We will explore and reframe the patient's negative self talk associated with the trauma and identify negative self-defeating thoughts replacing it with positive thoughts and talk.  Lastly we will teach relaxation techniques to help her manage anxiety associated with the trauma.  Reviewed treatment goals and extended the goals with a new target date of January 31 , 2026  Lorrene CHRISTELLA Hasten, [facility]                                  Lorrene CHRISTELLA Hasten, [facility]               Lorrene CHRISTELLA Hasten, [facility]               Lorrene CHRISTELLA Hasten, [facility]  Lorrene CHRISTELLA Hasten, [facility]               Lorrene CHRISTELLA Hasten, [facility]               Lorrene CHRISTELLA Hasten, [facility]               Lorrene CHRISTELLA Hasten, [facility]               Lorrene CHRISTELLA Hasten, [facility]               Lorrene CHRISTELLA Hasten, [facility]               Lorrene CHRISTELLA Hasten, [facility]               Lorrene CHRISTELLA Hasten, [facility]               Lorrene CHRISTELLA Hasten, [facility]               Lorrene CHRISTELLA Hasten, [facility]               Lorrene CHRISTELLA Hasten, [facility]               Lorrene CHRISTELLA Hasten, [facility]               Lorrene CHRISTELLA Hasten, [facility]               Lorrene CHRISTELLA Hasten, [facility]               Lorrene CHRISTELLA Hasten, [facility]               Lorrene CHRISTELLA Hasten, [facility]               Lorrene CHRISTELLA Hasten, [facility]               Lorrene CHRISTELLA Hasten, [facility]               Lorrene CHRISTELLA Hasten, [facility]               Lorrene CHRISTELLA Hasten, [facility]               Lorrene CHRISTELLA Hasten, [facility]               Lorrene CHRISTELLA Hasten, [facility]

## 2024-08-24 ENCOUNTER — Ambulatory Visit (INDEPENDENT_AMBULATORY_CARE_PROVIDER_SITE_OTHER): Admitting: Behavioral Health

## 2024-08-24 DIAGNOSIS — F4310 Post-traumatic stress disorder, unspecified: Secondary | ICD-10-CM

## 2024-08-25 ENCOUNTER — Encounter: Payer: Self-pay | Admitting: Behavioral Health

## 2024-08-25 NOTE — Progress Notes (Signed)
 [facility] Counselor/Therapist Progress Note  Patient ID: Tara Jones, MRN: [id number],    Date:08/24/24  Time Spent.  2:01 PM until 3 PM, 59 minutes.This session was held via video teletherapy. The patient consented to the video teletherapy and was located in her home during this session. She is aware it is the responsibility of the patient to secure confidentiality on her end of the session. The provider was in a private home office for the duration of this session.    Treatment Type: Individual Therapy  Reported Symptoms: Anxiety/stress, depression  Mental Status Exam: Appearance:  Casual and Well Groomed     Behavior: Appropriate  Motor: Normal  Speech/Language:  Normal Rate  Affect: Appropriate  Mood: normal  Thought process: normal  Thought content:   WNL  Sensory/Perceptual disturbances:   WNL  Orientation: oriented to person, place, time/date, situation, day of week, month of year, and year  Attention: Good  Concentration: Good  Memory: WNL  Fund of knowledge:  Good  Insight:   Good  Judgment:  Good  Impulse Control: Good   Risk Assessment: Danger to Self:  No Self-injurious Behavior: No Danger to Others: No Duty to Warn:no Physical Aggression / Violence:No  Access to Firearms a concern: No  Gang Involvement:No   Subjective: The patient said she had a scare with her arm over the past few days and initially did bring about significant pain and numbness.  She had difficulty sleeping that night but it did start to feel better but it was bad enough that she had to put her sling on.  She is going to the occupational therapist today and will ask about it but thinks that she may just have aggravated something and not done any damage necessarily.  She said that the friendships that have been fairly new with several girls in the group has started to change.  The 2 that she was closest to have become distant some.  1 has basically blocked her and she feels  that the reason is petty.  The other just has stayed out of the way.  1 has continued to reach out to her consistently which she appreciates.  Continue to talk about what the patient looks for in relationships.  She feels as if she has never been able to have her maintain a healthy relationship and there is some difficulty with trusting.  We will continue to work on how she can learn to trust when she has been through a lot of difficult relationships.  She has some very solid boundaries with her family members particularly.  She says her mother and specifically does not necessarily respect her understand those but the patient says she knows that is right.  She has not started to look for work yet but knows that she will have to look starting fairly soon.  She also has been isolating and recognizes that it is time to start getting help so we talked about what she might be able to do inexpensively but also get out of her house.  She does contract for safety having no thoughts of hurting herself or anyone else.  Interventions: Cognitive Behavioral Therapy  Diagnosis: Posttraumatic stress disorder  Plan: I will meet with the patient every week primarily  via care agility Progress: 40% Treatment plan: We will use cognitive behavioral therapy as well as elements of ego supportive therapy and dialectical behavior therapy with a goal of reducing the patient's anxiety and depression by at least 50% by  November 27, 2023.  Goals for depression include the patient having less depression as evidenced by her report in session as well as PH-9 scores, to have improved mood and for her to return to a healthier level of functioning.  We will look to identify causes for depressed mood and learn coping skills for reduction of depression.  Interventions including using cognitive behavioral therapy to help her explore and replace unhealthy thoughts and behaviors contributing to depression, processed how she experiences  depression daily, provide education about depression to help her identify its causes and symptoms as well as sharing feelings of depression.  We will teach and encouraged the use of coping skills for management of depression.  Goals for anxiety are to improve her ability to manage anxiety symptoms, panic symptoms and better handle stress.  We will identify causes for anxiety and panic and explore ways to reduce it as well as resolving and processing core conflicts that are contributing to anxiety and panic.  We will also work on managing worrisome thoughts contributing to feelings of anxiety and stress.  Interventions include providing education about anxiety to help her understand and identify its causes and symptoms, facilitate problem solution skills for reducing stress and anxiety as well as teach coping skills to manage anxiety such as grounding exercises, progressive muscle relaxation etc.  We will also use cognitive behavioral therapy to identify and change anxiety producing thoughts and behaviors as well as teach dialectical behavior therapy distress tolerance and mindfulness skills to help her learn anxiety management skills.  Goals for reducing trauma responses for PTSD include helping the patient recall the trauma without feeling overwhelmed or consumed with negative emotion as well as helping her to return to a pre trauma level of functioning.  Interventions include helping the patient identify the traumatic events impact her daily life as well as process feelings associated with the trauma including guilt and shame and sadness.  We will explore and as much detail as she is comfortable with sharing feelings regarding the trauma allowing for the gradual reduction of the emotional response through storytelling.  We will explore and reframe the patient's negative self talk associated with the trauma and identify negative self-defeating thoughts replacing it with positive thoughts and talk.  Lastly we  will teach relaxation techniques to help her manage anxiety associated with the trauma.  Reviewed treatment goals and extended the goals with a new target date of January 31 , 2026  Lorrene CHRISTELLA Hasten, [facility]                                  Lorrene CHRISTELLA Hasten, [facility]               Lorrene CHRISTELLA Hasten, [facility]               Lorrene CHRISTELLA Hasten, [facility]               Lorrene CHRISTELLA Hasten, [facility]               Lorrene CHRISTELLA Hasten, [facility]               Lorrene CHRISTELLA Hasten, [facility]               Lorrene CHRISTELLA Hasten, [facility]               Lorrene CHRISTELLA Hasten, [facility]  Lorrene CHRISTELLA Hasten, [facility]               Lorrene CHRISTELLA Hasten, [facility]               Lorrene CHRISTELLA Hasten, [facility]               Lorrene CHRISTELLA Hasten, [facility]               Lorrene CHRISTELLA Hasten, [facility]               Lorrene CHRISTELLA Hasten, [facility]               Lorrene CHRISTELLA Hasten, [facility]               Lorrene CHRISTELLA Hasten, [facility]               Lorrene CHRISTELLA Hasten, [facility]               Lorrene CHRISTELLA Hasten, [facility]               Lorrene CHRISTELLA Hasten, [facility]               Lorrene CHRISTELLA Hasten, [facility]               Lorrene CHRISTELLA Hasten, [facility]               Lorrene CHRISTELLA Hasten, [facility]               Lorrene CHRISTELLA Hasten, [facility]               Lorrene CHRISTELLA Hasten, [facility]               Lorrene CHRISTELLA Hasten, [facility]               Lorrene CHRISTELLA Hasten, [facility]

## 2024-09-01 ENCOUNTER — Ambulatory Visit: Admitting: Behavioral Health

## 2024-09-08 ENCOUNTER — Ambulatory Visit: Admitting: Behavioral Health

## 2024-09-08 ENCOUNTER — Encounter: Payer: Self-pay | Admitting: Behavioral Health

## 2024-09-08 DIAGNOSIS — F4310 Post-traumatic stress disorder, unspecified: Secondary | ICD-10-CM

## 2024-09-08 NOTE — Progress Notes (Signed)
 [facility] Counselor/Therapist Progress Note  Patient ID: Tara Jones, MRN: [id number],    Date:09/08/24  Time Spent.  1:01 PM until 2 PM, 59 minutes.This session was held via video teletherapy. The patient consented to the video teletherapy and was located in her home during this session. She is aware it is the responsibility of the patient to secure confidentiality on her end of the session. The provider was in a private home office for the duration of this session.    Treatment Type: Individual Therapy  Reported Symptoms: Anxiety/stress, depression  Mental Status Exam: Appearance:  Casual and Well Groomed     Behavior: Appropriate  Motor: Normal  Speech/Language:  Normal Rate  Affect: Appropriate  Mood: normal  Thought process: normal  Thought content:   WNL  Sensory/Perceptual disturbances:   WNL  Orientation: oriented to person, place, time/date, situation, day of week, month of year, and year  Attention: Good  Concentration: Good  Memory: WNL  Fund of knowledge:  Good  Insight:   Good  Judgment:  Good  Impulse Control: Good   Risk Assessment: Danger to Self:  No Self-injurious Behavior: No Danger to Others: No Duty to Warn:no Physical Aggression / Violence:No  Access to Firearms a concern: No  Gang Involvement:No   Subjective: The patient said that it is not scar tissue or arthritis is causing her pain in her arm.  She has been in significant pain over the past couple weeks and they are weighing options as to what to do next.  They put her on gabapentin which helped alleviate the nerve pain but said she was sleeping all hours of the day and was even fuzzy when she was not sleeping.  She thinks it might try nerve block for the next step.  There have been some positive steps.  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  Continue to look for work.  Been  some resolution with some of the friends group that she was a part of and many of the group recognize that there was not a right or wrong was my going on but that the patient was doing the right thing based on the situation and the players.  Recognize that she could have done things differently and is thankful that she took the higher road.  So has been doing some small self-care things including paying off some debt, doing some things for herself which she had not done before and said that little things like that just make her feel better so encouraged her to within her financial means right now continue to do some of those things but also challenge those negative cognitive thoughts about how she sees herself.  She does contract for safety having no thoughts of hurting herself or anyone else.  Interventions: Cognitive Behavioral Therapy  Diagnosis: Posttraumatic stress disorder  Plan: I will meet with the patient every week primarily  via care agility Progress: 40% Treatment plan: We will use cognitive behavioral therapy as well as elements of ego supportive therapy and dialectical behavior therapy with a goal of reducing the patient's anxiety and depression by at least 50% by November 27, 2023.  Goals for depression include the patient having less depression as evidenced by her report in session as well as PH-9 scores, to have improved mood and for her to return to a healthier  level of functioning.  We will look to identify causes for depressed mood and learn coping skills for reduction of depression.  Interventions including using cognitive behavioral therapy to help her explore and replace unhealthy thoughts and behaviors contributing to depression, processed how she experiences depression daily, provide education about depression to help her identify its causes and symptoms as well as sharing feelings of depression.  We will teach and encouraged the use of coping skills for management of  depression.  Goals for anxiety are to improve her ability to manage anxiety symptoms, panic symptoms and better handle stress.  We will identify causes for anxiety and panic and explore ways to reduce it as well as resolving and processing core conflicts that are contributing to anxiety and panic.  We will also work on managing worrisome thoughts contributing to feelings of anxiety and stress.  Interventions include providing education about anxiety to help her understand and identify its causes and symptoms, facilitate problem solution skills for reducing stress and anxiety as well as teach coping skills to manage anxiety such as grounding exercises, progressive muscle relaxation etc.  We will also use cognitive behavioral therapy to identify and change anxiety producing thoughts and behaviors as well as teach dialectical behavior therapy distress tolerance and mindfulness skills to help her learn anxiety management skills.  Goals for reducing trauma responses for PTSD include helping the patient recall the trauma without feeling overwhelmed or consumed with negative emotion as well as helping her to return to a pre trauma level of functioning.  Interventions include helping the patient identify the traumatic events impact her daily life as well as process feelings associated with the trauma including guilt and shame and sadness.  We will explore and as much detail as she is comfortable with sharing feelings regarding the trauma allowing for the gradual reduction of the emotional response through storytelling.  We will explore and reframe the patient's negative self talk associated with the trauma and identify negative self-defeating thoughts replacing it with positive thoughts and talk.  Lastly we will teach relaxation techniques to help her manage anxiety associated with the trauma.  Reviewed treatment goals and extended the goals with a new target date of January 31 , 2026  Tara Jones,  [facility]                                  Tara Jones, [facility]               Tara Jones, [facility]               Tara Jones, [facility]               Tara Jones, [facility]               Tara Jones, [facility]               Tara Jones, [facility]               Tara Jones, [facility]               Tara Jones, [facility]               Tara Jones, [facility]               Tara Jones, [facility]  Tara Jones, [facility]               Tara Jones, [facility]               Tara Jones, [facility]               Tara Jones, [facility]               Tara Jones, [facility]               Tara Jones, [facility]               Tara Jones, [facility]               Tara Jones, [facility]               Tara Jones, [facility]               Tara Jones, [facility]               Tara Jones, [facility]               Tara Jones, [facility]               Tara Jones, [facility]               Tara Jones, [facility]               Tara Jones, [facility]               Tara Jones, [facility]               Tara Jones, [facility]

## 2024-09-15 ENCOUNTER — Ambulatory Visit: Admitting: Behavioral Health

## 2024-09-15 ENCOUNTER — Encounter: Payer: Self-pay | Admitting: Behavioral Health

## 2024-09-15 DIAGNOSIS — F4310 Post-traumatic stress disorder, unspecified: Secondary | ICD-10-CM

## 2024-09-15 NOTE — Progress Notes (Signed)
 [facility] Counselor/Therapist Progress Note  Patient ID: Tara Jones, MRN: [id number],    Date:09/15/24  Time Spent.  2:05 PM until 2:59 PM, 54 minutes.This session was held via video teletherapy. The patient consented to the video teletherapy and was located in her home during this session. She is aware it is the responsibility of the patient to secure confidentiality on her end of the session. The provider was in a private home office for the duration of this session.    Treatment Type: Individual Therapy  Reported Symptoms: Anxiety/stress, depression  Mental Status Exam: Appearance:  Casual and Well Groomed     Behavior: Appropriate  Motor: Normal  Speech/Language:  Normal Rate  Affect: Appropriate  Mood: normal  Thought process: normal  Thought content:   WNL  Sensory/Perceptual disturbances:   WNL  Orientation: oriented to person, place, time/date, situation, day of week, month of year, and year  Attention: Good  Concentration: Good  Memory: WNL  Fund of knowledge:  Good  Insight:   Good  Judgment:  Good  Impulse Control: Good   Risk Assessment: Danger to Self:  No Self-injurious Behavior: No Danger to Others: No Duty to Warn:no Physical Aggression / Violence:No  Access to Firearms a concern: No  Gang Involvement:No   Subjective: The patient received some disappointing news that the job she hoped might become available is more than likely not going to happen.  There is a chance in the second quarter of next year that it will be made available but until then she will have to find something else to do.  She is actively starting to look.  She is disappointed but says she feels that she is managing her emotions fairly well and remaining optimistic.  There are some anxiety and that her arm is still not much better.  She is doing physical therapy regularly.  Yesterday it felt as if something was tingling her hand and there been electrical impulses running  through her arm.  She is going to [place]  next week with her parents.  She has set good boundaries with them and I encouraged her to continue to do that as she is time with them.  For the most part with 1 exception her anxiety has been manageable.  She was out in a car with the federal license plates followed her.  She said at first she thought it was coincidence but everywhere she went they went and she finally pulled into a fast food restaurant where they did pull into spaces down from her but when she went inside they did not follow.  She went into the bathroom where someone was banging on the door but she is not sure who it was.  She came out the car with the federal plates was gone and she went straight home.  Not sure what that was all about but tried to use all the coping skills that she could and eventually had to lay down when she got home but recovered quicker than she typically does. She does contract for safety having no thoughts of hurting herself or anyone else.  Interventions: Cognitive Behavioral Therapy  Diagnosis: Posttraumatic stress disorder  Plan: I will meet with the patient every week primarily  via care agility Progress: 40% Treatment plan: We will use cognitive behavioral therapy as well as elements of ego supportive therapy and dialectical behavior therapy with a goal of reducing the patient's anxiety and depression by at least 50% by November 27, 2023.  Goals for depression include the patient having less depression as evidenced by her report in session as well as PH-9 scores, to have improved mood and for her to return to a healthier level of functioning.  We will look to identify causes for depressed mood and learn coping skills for reduction of depression.  Interventions including using cognitive behavioral therapy to help her explore and replace unhealthy thoughts and behaviors contributing to depression, processed how she experiences depression daily, provide education  about depression to help her identify its causes and symptoms as well as sharing feelings of depression.  We will teach and encouraged the use of coping skills for management of depression.  Goals for anxiety are to improve her ability to manage anxiety symptoms, panic symptoms and better handle stress.  We will identify causes for anxiety and panic and explore ways to reduce it as well as resolving and processing core conflicts that are contributing to anxiety and panic.  We will also work on managing worrisome thoughts contributing to feelings of anxiety and stress.  Interventions include providing education about anxiety to help her understand and identify its causes and symptoms, facilitate problem solution skills for reducing stress and anxiety as well as teach coping skills to manage anxiety such as grounding exercises, progressive muscle relaxation etc.  We will also use cognitive behavioral therapy to identify and change anxiety producing thoughts and behaviors as well as teach dialectical behavior therapy distress tolerance and mindfulness skills to help her learn anxiety management skills.  Goals for reducing trauma responses for PTSD include helping the patient recall the trauma without feeling overwhelmed or consumed with negative emotion as well as helping her to return to a pre trauma level of functioning.  Interventions include helping the patient identify the traumatic events impact her daily life as well as process feelings associated with the trauma including guilt and shame and sadness.  We will explore and as much detail as she is comfortable with sharing feelings regarding the trauma allowing for the gradual reduction of the emotional response through storytelling.  We will explore and reframe the patient's negative self talk associated with the trauma and identify negative self-defeating thoughts replacing it with positive thoughts and talk.  Lastly we will teach relaxation techniques to  help her manage anxiety associated with the trauma.  Reviewed treatment goals and extended the goals with a new target date of January 31 , 2026  Tara Jones, [facility]                                  Tara Jones, [facility]               Tara Jones, [facility]               Tara Jones, [facility]               Tara Jones, [facility]               Tara Jones, [facility]               Tara Jones, [facility]               Tara Jones, [facility]               Tara Jones, [facility]               Tara CHRISTELLA  Jones, [facility]               Tara Jones, [facility]               Tara Jones, [facility]               Tara Jones, [facility]               Tara Jones, [facility]               Tara Jones, [facility]               Tara Jones, [facility]               Tara Jones, [facility]               Tara Jones, [facility]               Tara Jones, [facility]               Tara Jones, [facility]               Tara Jones, [facility]               Tara Jones, [facility]               Tara Jones, [facility]               Tara Jones, [facility]               Tara Jones, [facility]               Tara Jones, [facility]               Tara Jones, [facility]               Tara Jones, [facility]               Tara Jones, [facility]

## 2024-09-20 ENCOUNTER — Ambulatory Visit: Admitting: Behavioral Health

## 2024-09-29 ENCOUNTER — Encounter: Payer: Self-pay | Admitting: Behavioral Health

## 2024-09-29 ENCOUNTER — Ambulatory Visit: Payer: Self-pay | Admitting: Behavioral Health

## 2024-09-29 DIAGNOSIS — F4310 Post-traumatic stress disorder, unspecified: Secondary | ICD-10-CM

## 2024-09-29 NOTE — Progress Notes (Signed)
 [facility] Counselor/Therapist Progress Note  Patient ID: Tara Jones, MRN: [id number],    Date: September 29, 2024.  Time Spent.  4:04 PM until 5 PM, 56 minutes.This session was held via video teletherapy. The patient consented to the video teletherapy and was located in her home during this session. She is aware it is the responsibility of the patient to secure confidentiality on her end of the session. The provider was in a private home office for the duration of this session.    Treatment Type: Individual Therapy  Reported Symptoms: Anxiety/stress, depression  Mental Status Exam: Appearance:  Casual and Well Groomed     Behavior: Appropriate  Motor: Normal  Speech/Language:  Normal Rate  Affect: Appropriate  Mood: normal  Thought process: normal  Thought content:   WNL  Sensory/Perceptual disturbances:   WNL  Orientation: oriented to person, place, time/date, situation, day of week, month of year, and year  Attention: Good  Concentration: Good  Memory: WNL  Fund of knowledge:  Good  Insight:   Good  Judgment:  Good  Impulse Control: Good   Risk Assessment: Danger to Self:  No Self-injurious Behavior: No Danger to Others: No Duty to Warn:no Physical Aggression / Violence:No  Access to Firearms a concern: No  Gang Involvement:No   Subjective: The patient went with her younger brother and parents to [place]  for the Thanksgiving holiday.  She said for the most part it was not good.  Her mother continued to be in her space.  Her brother said some very hurtful things to her.  She noticed some coming back that she was very tired physically mentally emotionally.  She thought initially it was just the exhaustion from being with her family for that long.  Then she thought she might be coming down with something but does not have anything medically that she can pinpoint.  She says she just wants to sleep all the time.  She is trying to get out and do things to  push back against the depression and she has done some of this successfully but still just wants to come home and sleep.  Thankfully she and her neighbor have started doing some things together so they are going out this weekend and she is doing a craft fair this weekend.  She is going to get a Christmas tree tonight.  Is not having any thoughts of self-harm and does contract for safety.  She said she let some leniency and in the relationship with her parents and she always feels disappointed when she does that so she is not setting those boundaries again.  She had an escape with some online friends but said to them continue to try to make her look bad when she has nothing to do with him over the last couple of months and is hesitant to engage with some of the other friends who are a part of that group.  I encouraged her to consider that if she can do that privately without the other 2 who are creating the distress as a way to keep her mind and hands busy also.  If she has not found a new job yet but will continue to look. She does contract for safety having no thoughts of hurting herself or anyone else.  Interventions: Cognitive Behavioral Therapy  Diagnosis: Posttraumatic stress disorder  Plan: I will meet with the patient every week primarily  via care agility Progress: 40% Treatment plan: We will use cognitive behavioral therapy  as well as elements of ego supportive therapy and dialectical behavior therapy with a goal of reducing the patient's anxiety and depression by at least 50% by November 27, 2023.  Goals for depression include the patient having less depression as evidenced by her report in session as well as PH-9 scores, to have improved mood and for her to return to a healthier level of functioning.  We will look to identify causes for depressed mood and learn coping skills for reduction of depression.  Interventions including using cognitive behavioral therapy to help her explore and replace  unhealthy thoughts and behaviors contributing to depression, processed how she experiences depression daily, provide education about depression to help her identify its causes and symptoms as well as sharing feelings of depression.  We will teach and encouraged the use of coping skills for management of depression.  Goals for anxiety are to improve her ability to manage anxiety symptoms, panic symptoms and better handle stress.  We will identify causes for anxiety and panic and explore ways to reduce it as well as resolving and processing core conflicts that are contributing to anxiety and panic.  We will also work on managing worrisome thoughts contributing to feelings of anxiety and stress.  Interventions include providing education about anxiety to help her understand and identify its causes and symptoms, facilitate problem solution skills for reducing stress and anxiety as well as teach coping skills to manage anxiety such as grounding exercises, progressive muscle relaxation etc.  We will also use cognitive behavioral therapy to identify and change anxiety producing thoughts and behaviors as well as teach dialectical behavior therapy distress tolerance and mindfulness skills to help her learn anxiety management skills.  Goals for reducing trauma responses for PTSD include helping the patient recall the trauma without feeling overwhelmed or consumed with negative emotion as well as helping her to return to a pre trauma level of functioning.  Interventions include helping the patient identify the traumatic events impact her daily life as well as process feelings associated with the trauma including guilt and shame and sadness.  We will explore and as much detail as she is comfortable with sharing feelings regarding the trauma allowing for the gradual reduction of the emotional response through storytelling.  We will explore and reframe the patient's negative self talk associated with the trauma and identify  negative self-defeating thoughts replacing it with positive thoughts and talk.  Lastly we will teach relaxation techniques to help her manage anxiety associated with the trauma.  Reviewed treatment goals and extended the goals with a new target date of January 31 , 2026  Lorrene CHRISTELLA Hasten, [facility]                                  Lorrene CHRISTELLA Hasten, [facility]               Lorrene CHRISTELLA Hasten, [facility]               Lorrene CHRISTELLA Hasten, [facility]               Lorrene CHRISTELLA Hasten, [facility]               Lorrene CHRISTELLA Hasten, [facility]               Lorrene CHRISTELLA Hasten, [facility]               Lorrene CHRISTELLA Hasten, [facility]  Lorrene CHRISTELLA Hasten, [facility]               Lorrene CHRISTELLA Hasten, [facility]               Lorrene CHRISTELLA Hasten, [facility]               Lorrene CHRISTELLA Hasten, [facility]               Lorrene CHRISTELLA Hasten, [facility]               Lorrene CHRISTELLA Hasten, [facility]               Lorrene CHRISTELLA Hasten, [facility]               Lorrene CHRISTELLA Hasten, [facility]               Lorrene CHRISTELLA Hasten, [facility]               Lorrene CHRISTELLA Hasten, [facility]               Lorrene CHRISTELLA Hasten, [facility]               Lorrene CHRISTELLA Hasten, [facility]               Lorrene CHRISTELLA Hasten, [facility]               Lorrene CHRISTELLA Hasten, [facility]               Lorrene CHRISTELLA Hasten, [facility]               Lorrene CHRISTELLA Hasten, [facility]               Lorrene CHRISTELLA Hasten, [facility]               Lorrene CHRISTELLA Hasten, [facility]               Lorrene CHRISTELLA Hasten, [facility]               Lorrene CHRISTELLA Hasten, [facility]               Lorrene CHRISTELLA Hasten, [facility]               Lorrene CHRISTELLA Hasten, [facility]

## 2024-10-05 ENCOUNTER — Encounter: Payer: Self-pay | Admitting: Behavioral Health

## 2024-10-06 ENCOUNTER — Ambulatory Visit: Payer: Self-pay | Admitting: Behavioral Health

## 2024-10-06 ENCOUNTER — Encounter: Payer: Self-pay | Admitting: Behavioral Health

## 2024-10-06 DIAGNOSIS — F4310 Post-traumatic stress disorder, unspecified: Secondary | ICD-10-CM

## 2024-10-06 NOTE — Progress Notes (Signed)
 [facility] Counselor/Therapist Progress Note  Patient ID: Tara Jones, MRN: [id number],    Date: October 06, 2024.  Time Spent.  3:01 PM until 4 PM, 59 minutes.This session was held via video teletherapy. The patient consented to the video teletherapy and was located in her home during this session. She is aware it is the responsibility of the patient to secure confidentiality on her end of the session. The provider was in a private home office for the duration of this session.    Treatment Type: Individual Therapy  Reported Symptoms: Anxiety/stress, depression  Mental Status Exam: Appearance:  Casual and Well Groomed     Behavior: Appropriate  Motor: Normal  Speech/Language:  Normal Rate  Affect: Appropriate  Mood: normal  Thought process: normal  Thought content:   WNL  Sensory/Perceptual disturbances:   WNL  Orientation: oriented to person, place, time/date, situation, day of week, month of year, and year  Attention: Good  Concentration: Good  Memory: WNL  Fund of knowledge:  Good  Insight:   Good  Judgment:  Good  Impulse Control: Good   Risk Assessment: Danger to Self:  No Self-injurious Behavior: No Danger to Others: No Duty to Warn:no Physical Aggression / Violence:No  Access to Firearms a concern: No  Gang Involvement:No   Subjective: The patient met with her psychiatrist and they increased her Vyvanse.  Is too early to see if it makes a difference but they chose not to change any other of her medications other than to come off of the gabapentin.  She continues to report at least moderate depression which she says has been worse this week than last week.  The positive is that with the new direction in physical therapy she has significantly less pain in her arm.  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  She is doing all that she can to cope including doing things with a friend and  neighbor on a consistent basis.  For the most part she is not staying in bed most of the morning.  She is medication compliant.  She is going to her physical therapy.  She did do her homework and what Christmas tree shopping after our last session.  She went for 5 places some of which she could not get out.  She said she realized later he was not the grounds that created anxiety for her but that everyone there shopping was family and she could not make yourself do that by herself.  She did go to a craft store to buy an artificial tree and is pleased with that.  She recognizes that she cannot change anything about the family situation right now but is setting healthy boundaries with her own family.  We continue to talk about what it looks like and who she wants to be in healthy relationship and what they look like.  She does contract for safety having no thoughts of hurting herself or anyone else.  Interventions: Cognitive Behavioral Therapy  Diagnosis: Posttraumatic stress disorder  Plan: I will meet with the patient every week primarily  via care agility Progress: 40% Treatment plan: We will use cognitive behavioral therapy as well as elements of ego supportive therapy and dialectical behavior therapy with a goal of reducing the patient's anxiety and depression by at least 50% by November 27, 2023.  Goals for depression include the patient having less depression as evidenced by her report in session as well  as PH-9 scores, to have improved mood and for her to return to a healthier level of functioning.  We will look to identify causes for depressed mood and learn coping skills for reduction of depression.  Interventions including using cognitive behavioral therapy to help her explore and replace unhealthy thoughts and behaviors contributing to depression, processed how she experiences depression daily, provide education about depression to help her identify its causes and symptoms as well as sharing  feelings of depression.  We will teach and encouraged the use of coping skills for management of depression.  Goals for anxiety are to improve her ability to manage anxiety symptoms, panic symptoms and better handle stress.  We will identify causes for anxiety and panic and explore ways to reduce it as well as resolving and processing core conflicts that are contributing to anxiety and panic.  We will also work on managing worrisome thoughts contributing to feelings of anxiety and stress.  Interventions include providing education about anxiety to help her understand and identify its causes and symptoms, facilitate problem solution skills for reducing stress and anxiety as well as teach coping skills to manage anxiety such as grounding exercises, progressive muscle relaxation etc.  We will also use cognitive behavioral therapy to identify and change anxiety producing thoughts and behaviors as well as teach dialectical behavior therapy distress tolerance and mindfulness skills to help her learn anxiety management skills.  Goals for reducing trauma responses for PTSD include helping the patient recall the trauma without feeling overwhelmed or consumed with negative emotion as well as helping her to return to a pre trauma level of functioning.  Interventions include helping the patient identify the traumatic events impact her daily life as well as process feelings associated with the trauma including guilt and shame and sadness.  We will explore and as much detail as she is comfortable with sharing feelings regarding the trauma allowing for the gradual reduction of the emotional response through storytelling.  We will explore and reframe the patient's negative self talk associated with the trauma and identify negative self-defeating thoughts replacing it with positive thoughts and talk.  Lastly we will teach relaxation techniques to help her manage anxiety associated with the trauma.  Reviewed treatment goals and  extended the goals with a new target date of January 31 , 2026  Lorrene CHRISTELLA Hasten, [facility]                                  Lorrene CHRISTELLA Hasten, [facility]               Lorrene CHRISTELLA Hasten, [facility]               Lorrene CHRISTELLA Hasten, [facility]               Lorrene CHRISTELLA Hasten, [facility]               Lorrene CHRISTELLA Hasten, [facility]               Lorrene CHRISTELLA Hasten, [facility]               Lorrene CHRISTELLA Hasten, [facility]               Lorrene CHRISTELLA Hasten, [facility]               Lorrene CHRISTELLA Hasten, [facility]               Lorrene CHRISTELLA  Zulema, [facility]               Lorrene CHRISTELLA Zulema, [facility]               Lorrene CHRISTELLA Zulema, [facility]               Lorrene CHRISTELLA Zulema, [facility]               Lorrene CHRISTELLA Zulema, [facility]               Lorrene CHRISTELLA Zulema, [facility]               Lorrene CHRISTELLA Zulema, [facility]               Lorrene CHRISTELLA Zulema, [facility]               Lorrene CHRISTELLA Zulema, [facility]               Lorrene CHRISTELLA Zulema, [facility]               Lorrene CHRISTELLA Zulema, [facility]               Lorrene CHRISTELLA Zulema, [facility]               Lorrene CHRISTELLA Zulema, [facility]               Lorrene CHRISTELLA Zulema, [facility]               Lorrene CHRISTELLA Zulema, [facility]               Lorrene CHRISTELLA Zulema, [facility]               Lorrene CHRISTELLA Zulema, [facility]               Lorrene CHRISTELLA Zulema, [facility]               Lorrene CHRISTELLA Zulema, [facility]               Lorrene CHRISTELLA Zulema, [facility]               Lorrene CHRISTELLA Zulema, [facility]

## 2024-10-13 ENCOUNTER — Encounter: Payer: Self-pay | Admitting: Behavioral Health

## 2024-10-13 ENCOUNTER — Ambulatory Visit: Payer: Self-pay | Admitting: Behavioral Health

## 2024-10-13 DIAGNOSIS — F4310 Post-traumatic stress disorder, unspecified: Secondary | ICD-10-CM

## 2024-10-13 NOTE — Progress Notes (Signed)
 [facility] Counselor/Therapist Progress Note  Patient ID: Tara Jones, MRN: [id number],    Date: October 13, 2024.  Time Spent.  3:06 PM until 4 PM, 54 minutes.This session was held via video teletherapy. The patient consented to the video teletherapy and was located in her home during this session. She is aware it is the responsibility of the patient to secure confidentiality on her end of the session. The provider was in a private home office for the duration of this session.    Treatment Type: Individual Therapy  Reported Symptoms: Anxiety/stress, depression  Mental Status Exam: Appearance:  Casual and Well Groomed     Behavior: Appropriate  Motor: Normal  Speech/Language:  Normal Rate  Affect: Appropriate  Mood: normal  Thought process: normal  Thought content:   WNL  Sensory/Perceptual disturbances:   WNL  Orientation: oriented to person, place, time/date, situation, day of week, month of year, and year  Attention: Good  Concentration: Good  Memory: WNL  Fund of knowledge:  Good  Insight:   Good  Judgment:  Good  Impulse Control: Good   Risk Assessment: Danger to Self:  No Self-injurious Behavior: No Danger to Others: No Duty to Warn:no Physical Aggression / Violence:No  Access to Firearms a concern: No  Gang Involvement:No   Subjective: The patient reports that looking for a job this time year is extremely difficult.  There is not a lot of availability and getting someone to respond is difficult.  This is putting her at a difficult place financially and she had ask family members for money which she did not want to have to do because they hold out over her head.  We also talked about her family's view on Christmas and gift giving and how she sees that has completely opposite of what the season is supposed to be about.  We also looked more closely at why she struggles so much with her mother and gets angry so easily.  She does recognize some of the  reasons why it is so easy to be quick to anger we talked about cognitively what she can start to do including acceptance of who her mother is coupled with mindfulness to be aware of her mental and emotional responses to her mom.  She does contract for safety having no thoughts of hurting herself or anyone else.  Interventions: Cognitive Behavioral Therapy  Diagnosis: Posttraumatic stress disorder  Plan: I will meet with the patient every week primarily  via care agility Progress: 40% Treatment plan: We will use cognitive behavioral therapy as well as elements of ego supportive therapy and dialectical behavior therapy with a goal of reducing the patient's anxiety and depression by at least 50% by November 27, 2023.  Goals for depression include the patient having less depression as evidenced by her report in session as well as PH-9 scores, to have improved mood and for her to return to a healthier level of functioning.  We will look to identify causes for depressed mood and learn coping skills for reduction of depression.  Interventions including using cognitive behavioral therapy to help her explore and replace unhealthy thoughts and behaviors contributing to depression, processed how she experiences depression daily, provide education about depression to help her identify its causes and symptoms as well as sharing feelings of depression.  We will teach and encouraged the use of coping skills for management of depression.  Goals for anxiety are to improve her ability to manage anxiety symptoms, panic symptoms and  better handle stress.  We will identify causes for anxiety and panic and explore ways to reduce it as well as resolving and processing core conflicts that are contributing to anxiety and panic.  We will also work on managing worrisome thoughts contributing to feelings of anxiety and stress.  Interventions include providing education about anxiety to help her understand and identify its causes and  symptoms, facilitate problem solution skills for reducing stress and anxiety as well as teach coping skills to manage anxiety such as grounding exercises, progressive muscle relaxation etc.  We will also use cognitive behavioral therapy to identify and change anxiety producing thoughts and behaviors as well as teach dialectical behavior therapy distress tolerance and mindfulness skills to help her learn anxiety management skills.  Goals for reducing trauma responses for PTSD include helping the patient recall the trauma without feeling overwhelmed or consumed with negative emotion as well as helping her to return to a pre trauma level of functioning.  Interventions include helping the patient identify the traumatic events impact her daily life as well as process feelings associated with the trauma including guilt and shame and sadness.  We will explore and as much detail as she is comfortable with sharing feelings regarding the trauma allowing for the gradual reduction of the emotional response through storytelling.  We will explore and reframe the patient's negative self talk associated with the trauma and identify negative self-defeating thoughts replacing it with positive thoughts and talk.  Lastly we will teach relaxation techniques to help her manage anxiety associated with the trauma.  Reviewed treatment goals and extended the goals with a new target date of January 31 , 2026  Lorrene CHRISTELLA Hasten, [facility]                                  Lorrene CHRISTELLA Hasten, [facility]               Lorrene CHRISTELLA Hasten, [facility]               Lorrene CHRISTELLA Hasten, [facility]               Lorrene CHRISTELLA Hasten, [facility]               Lorrene CHRISTELLA Hasten, [facility]               Lorrene CHRISTELLA Hasten, [facility]               Lorrene CHRISTELLA Hasten, [facility]               Lorrene CHRISTELLA Hasten, [facility]               Lorrene CHRISTELLA Hasten,  [facility]               Lorrene CHRISTELLA Hasten, [facility]               Lorrene CHRISTELLA Hasten, [facility]               Lorrene CHRISTELLA Hasten, [facility]               Lorrene CHRISTELLA Hasten, [facility]               Lorrene CHRISTELLA Hasten, [facility]               Lorrene CHRISTELLA Hasten, [facility]               Lorrene CHRISTELLA Hasten, [facility]  Lorrene CHRISTELLA Hasten, [facility]               Lorrene CHRISTELLA Hasten, [facility]               Lorrene CHRISTELLA Hasten, [facility]               Lorrene CHRISTELLA Hasten, [facility]               Lorrene CHRISTELLA Hasten, [facility]               Lorrene CHRISTELLA Hasten, [facility]               Lorrene CHRISTELLA Hasten, [facility]               Lorrene CHRISTELLA Hasten, [facility]               Lorrene CHRISTELLA Hasten, [facility]               Lorrene CHRISTELLA Hasten, [facility]               Lorrene CHRISTELLA Hasten, [facility]               Lorrene CHRISTELLA Hasten, [facility]               Lorrene CHRISTELLA Hasten, [facility]               Lorrene CHRISTELLA Hasten, [facility]               Lorrene CHRISTELLA Hasten, [facility]

## 2024-10-19 ENCOUNTER — Ambulatory Visit: Admitting: Behavioral Health

## 2024-10-19 ENCOUNTER — Ambulatory Visit: Payer: Self-pay | Admitting: Behavioral Health
